# Patient Record
Sex: Female | Born: 1955 | Race: White | Hispanic: No | Marital: Married | Smoking: Never smoker
Health system: Southern US, Community
[De-identification: ages and names within clinical notes are randomized; demographics above are authoritative.]

## PROBLEM LIST (undated history)

## (undated) ENCOUNTER — Ambulatory Visit (HOSPITAL_COMMUNITY): Admission: EM | Source: Home / Self Care

## (undated) DIAGNOSIS — K529 Noninfective gastroenteritis and colitis, unspecified: Secondary | ICD-10-CM

## (undated) DIAGNOSIS — I10 Essential (primary) hypertension: Secondary | ICD-10-CM

## (undated) DIAGNOSIS — I509 Heart failure, unspecified: Secondary | ICD-10-CM

## (undated) HISTORY — DX: Noninfective gastroenteritis and colitis, unspecified: K52.9

## (undated) HISTORY — PX: ABDOMINAL HYSTERECTOMY: SHX81

---

## 1997-11-23 ENCOUNTER — Emergency Department (HOSPITAL_COMMUNITY): Admission: EM | Admit: 1997-11-23 | Discharge: 1997-11-23 | Payer: Self-pay | Admitting: Family Medicine

## 1997-11-25 ENCOUNTER — Emergency Department (HOSPITAL_COMMUNITY): Admission: EM | Admit: 1997-11-25 | Discharge: 1997-11-25 | Payer: Self-pay | Admitting: Emergency Medicine

## 1998-03-06 HISTORY — PX: ABDOMINAL HYSTERECTOMY W/ PARTIAL VAGINACTOMY: SUR660

## 2001-06-12 ENCOUNTER — Emergency Department (HOSPITAL_COMMUNITY): Admission: EM | Admit: 2001-06-12 | Discharge: 2001-06-12 | Payer: Self-pay | Admitting: Emergency Medicine

## 2001-06-14 ENCOUNTER — Inpatient Hospital Stay (HOSPITAL_COMMUNITY): Admission: EM | Admit: 2001-06-14 | Discharge: 2001-06-23 | Payer: Self-pay | Admitting: *Deleted

## 2001-06-14 ENCOUNTER — Encounter: Payer: Self-pay | Admitting: *Deleted

## 2001-06-15 ENCOUNTER — Encounter: Payer: Self-pay | Admitting: Surgery

## 2001-06-18 ENCOUNTER — Encounter: Payer: Self-pay | Admitting: Family Medicine

## 2001-06-19 ENCOUNTER — Encounter (HOSPITAL_BASED_OUTPATIENT_CLINIC_OR_DEPARTMENT_OTHER): Payer: Self-pay | Admitting: General Surgery

## 2001-06-21 ENCOUNTER — Encounter (HOSPITAL_BASED_OUTPATIENT_CLINIC_OR_DEPARTMENT_OTHER): Payer: Self-pay | Admitting: General Surgery

## 2003-03-12 ENCOUNTER — Emergency Department (HOSPITAL_COMMUNITY): Admission: EM | Admit: 2003-03-12 | Discharge: 2003-03-12 | Payer: Self-pay | Admitting: Emergency Medicine

## 2003-11-10 ENCOUNTER — Emergency Department (HOSPITAL_COMMUNITY): Admission: EM | Admit: 2003-11-10 | Discharge: 2003-11-10 | Payer: Self-pay | Admitting: Family Medicine

## 2003-11-24 ENCOUNTER — Emergency Department (HOSPITAL_COMMUNITY): Admission: EM | Admit: 2003-11-24 | Discharge: 2003-11-24 | Payer: Self-pay | Admitting: Family Medicine

## 2005-05-01 ENCOUNTER — Emergency Department (HOSPITAL_COMMUNITY): Admission: EM | Admit: 2005-05-01 | Discharge: 2005-05-01 | Payer: Self-pay | Admitting: Family Medicine

## 2005-07-29 ENCOUNTER — Emergency Department (HOSPITAL_COMMUNITY): Admission: EM | Admit: 2005-07-29 | Discharge: 2005-07-29 | Payer: Self-pay | Admitting: Emergency Medicine

## 2005-08-09 ENCOUNTER — Emergency Department (HOSPITAL_COMMUNITY): Admission: EM | Admit: 2005-08-09 | Discharge: 2005-08-09 | Payer: Self-pay | Admitting: Emergency Medicine

## 2007-02-06 ENCOUNTER — Emergency Department (HOSPITAL_COMMUNITY): Admission: EM | Admit: 2007-02-06 | Discharge: 2007-02-07 | Payer: Self-pay | Admitting: Family Medicine

## 2008-03-06 HISTORY — PX: CARDIAC CATHETERIZATION: SHX172

## 2008-03-09 ENCOUNTER — Inpatient Hospital Stay (HOSPITAL_COMMUNITY): Admission: EM | Admit: 2008-03-09 | Discharge: 2008-03-13 | Payer: Self-pay | Admitting: Emergency Medicine

## 2008-03-10 ENCOUNTER — Encounter (INDEPENDENT_AMBULATORY_CARE_PROVIDER_SITE_OTHER): Payer: Self-pay | Admitting: Internal Medicine

## 2008-04-10 ENCOUNTER — Ambulatory Visit: Payer: Self-pay | Admitting: Family Medicine

## 2008-06-30 ENCOUNTER — Ambulatory Visit (HOSPITAL_COMMUNITY): Admission: RE | Admit: 2008-06-30 | Discharge: 2008-06-30 | Payer: Self-pay | Admitting: Cardiology

## 2008-06-30 ENCOUNTER — Encounter (INDEPENDENT_AMBULATORY_CARE_PROVIDER_SITE_OTHER): Payer: Self-pay | Admitting: Cardiology

## 2008-07-21 ENCOUNTER — Ambulatory Visit: Payer: Self-pay | Admitting: Family Medicine

## 2010-04-10 ENCOUNTER — Emergency Department (HOSPITAL_COMMUNITY): Payer: Self-pay

## 2010-04-10 ENCOUNTER — Observation Stay (HOSPITAL_COMMUNITY)
Admission: EM | Admit: 2010-04-10 | Discharge: 2010-04-11 | Disposition: A | Discharge status: Home / Self Care | Payer: Self-pay | Attending: Internal Medicine | Admitting: Internal Medicine

## 2010-04-10 DIAGNOSIS — I10 Essential (primary) hypertension: Secondary | ICD-10-CM | POA: Insufficient documentation

## 2010-04-10 DIAGNOSIS — R05 Cough: Secondary | ICD-10-CM | POA: Insufficient documentation

## 2010-04-10 DIAGNOSIS — R0602 Shortness of breath: Secondary | ICD-10-CM | POA: Insufficient documentation

## 2010-04-10 DIAGNOSIS — Z7982 Long term (current) use of aspirin: Secondary | ICD-10-CM | POA: Insufficient documentation

## 2010-04-10 DIAGNOSIS — R059 Cough, unspecified: Secondary | ICD-10-CM | POA: Insufficient documentation

## 2010-04-10 DIAGNOSIS — M47814 Spondylosis without myelopathy or radiculopathy, thoracic region: Secondary | ICD-10-CM | POA: Insufficient documentation

## 2010-04-10 DIAGNOSIS — R0989 Other specified symptoms and signs involving the circulatory and respiratory systems: Secondary | ICD-10-CM | POA: Insufficient documentation

## 2010-04-10 DIAGNOSIS — R0609 Other forms of dyspnea: Secondary | ICD-10-CM | POA: Insufficient documentation

## 2010-04-10 DIAGNOSIS — I509 Heart failure, unspecified: Secondary | ICD-10-CM | POA: Insufficient documentation

## 2010-04-10 DIAGNOSIS — I428 Other cardiomyopathies: Secondary | ICD-10-CM | POA: Insufficient documentation

## 2010-04-10 DIAGNOSIS — Z79899 Other long term (current) drug therapy: Secondary | ICD-10-CM | POA: Insufficient documentation

## 2010-04-10 DIAGNOSIS — R197 Diarrhea, unspecified: Secondary | ICD-10-CM | POA: Insufficient documentation

## 2010-04-10 DIAGNOSIS — R079 Chest pain, unspecified: Principal | ICD-10-CM | POA: Insufficient documentation

## 2010-04-10 LAB — LIPASE, BLOOD: Lipase: 31 U/L (ref 11–59)

## 2010-04-10 LAB — BASIC METABOLIC PANEL
GFR calc Af Amer: >60 mL/min (ref >60)
GFR calc non Af Amer: >60 mL/min (ref >60)
Glucose, Bld: 93 mg/dL (ref 70–99)
Potassium: 4.1 mEq/L (ref 3.5–5.1)
Sodium: 139 mEq/L (ref 135–145)

## 2010-04-10 LAB — DIFFERENTIAL
Basophils Absolute: 0 10*3/uL (ref 0.0–0.1)
Eosinophils Relative: 2 % (ref 0–5)
Lymphocytes Relative: 41 % (ref 12–46)
Neutro Abs: 4.2 10*3/uL (ref 1.7–7.7)

## 2010-04-10 LAB — HEPATIC FUNCTION PANEL
Albumin: 3.6 g/dL (ref 3.5–5.2)
Bilirubin, Direct: 0.1 mg/dL (ref 0.0–0.3)
Indirect Bilirubin: 0.4 mg/dL (ref 0.3–0.9)
Total Bilirubin: 0.5 mg/dL (ref 0.3–1.2)

## 2010-04-10 LAB — POCT CARDIAC MARKERS
CKMB, poc: <1 ng/mL — ABNORMAL LOW (ref 1.0–8.0)
Troponin i, poc: <0.05 ng/mL (ref 0.00–0.09)

## 2010-04-10 LAB — CK TOTAL AND CKMB (NOT AT ARMC)
Relative Index: INVALID (ref 0.0–2.5)
Total CK: 68 U/L (ref 7–177)

## 2010-04-10 LAB — CBC
HCT: 39.2 % (ref 36.0–46.0)
RDW: 12.5 % (ref 11.5–15.5)
WBC: 8.8 10*3/uL (ref 4.0–10.5)

## 2010-04-10 LAB — BRAIN NATRIURETIC PEPTIDE: Pro B Natriuretic peptide (BNP): <30 pg/mL (ref 0.0–100.0)

## 2010-04-10 LAB — TROPONIN I: Troponin I: 0.01 ng/mL (ref 0.00–0.06)

## 2010-04-10 LAB — D-DIMER, QUANTITATIVE: D-Dimer, Quant: <0.22 ug/mL-FEU (ref 0.00–0.48)

## 2010-04-11 LAB — LIPID PANEL
LDL Cholesterol: 106 mg/dL — ABNORMAL HIGH (ref 0–99)
Triglycerides: 244 mg/dL — ABNORMAL HIGH (ref <150)

## 2010-04-11 LAB — CBC
MCH: 29.2 pg (ref 26.0–34.0)
MCHC: 32.8 g/dL (ref 30.0–36.0)
Platelets: 246 10*3/uL (ref 150–400)
RDW: 12.4 % (ref 11.5–15.5)

## 2010-04-11 LAB — COMPREHENSIVE METABOLIC PANEL
CO2: 26 mEq/L (ref 19–32)
Calcium: 8.9 mg/dL (ref 8.4–10.5)
Chloride: 105 mEq/L (ref 96–112)
Creatinine, Ser: 0.83 mg/dL (ref 0.4–1.2)
GFR calc non Af Amer: >60 mL/min (ref >60)
Glucose, Bld: 113 mg/dL — ABNORMAL HIGH (ref 70–99)
Total Bilirubin: 0.7 mg/dL (ref 0.3–1.2)

## 2010-04-11 LAB — CARDIAC PANEL(CRET KIN+CKTOT+MB+TROPI)
CK, MB: 1 ng/mL (ref 0.3–4.0)
Relative Index: INVALID (ref 0.0–2.5)
Total CK: 68 U/L (ref 7–177)
Troponin I: 0.02 ng/mL (ref 0.00–0.06)
Troponin I: 0.01 ng/mL (ref 0.00–0.06)

## 2010-04-11 LAB — TSH: TSH: 1.559 u[IU]/mL (ref 0.350–4.500)

## 2010-04-11 LAB — MAGNESIUM: Magnesium: 2.2 mg/dL (ref 1.5–2.5)

## 2010-04-17 NOTE — Discharge Summary (Signed)
[name], [name]                ACCOUNT NO.:  [id number]  MEDICAL RECORD NO.:  [id number]           PATIENT TYPE:  I  LOCATION:  4710                         FACILITY:  [facility]  PHYSICIAN:  Hartley Barefoot, MD    DATE OF BIRTH:  07/21/1955  DATE OF ADMISSION:  04/10/2010 DATE OF DISCHARGE:  04/11/2010                              DISCHARGE SUMMARY   DISCHARGE DIAGNOSES: 1. Chest pain, rule out for acute coronary syndrome.   Other past medical history, 2. history of systolic heart failure,     ejection fraction 40-45%, 2010. 3. Nonischemic cardiomyopathy. 4. History of chronic diarrhea. 5. Hypertension.  PAST SURGICAL HISTORY:  Hysterectomy.  DISCHARGE MEDICATIONS: 1. Acid reducer one tablet by mouth twice daily. 2. Aspirin 81 mg p.o. every morning. 3. Cetirizine 10 mg one tablet by mouth daily. 4. Furosemide 40 mg half tablet by mouth daily. 5. Lisinopril 20 mg one tablet by mouth every morning. 6. Loperamide 2 mg two capsules by mouth three times a day before     meals. 7. Metoprolol 100 mg half tablet by mouth twice daily. 8. Sleep-Aid two tablets by mouth daily at bedtime. 9. Spironolactone 25 mg one tablet by mouth every morning. 10.Tylenol Extra Strength 500 mg two tablet by mouth twice daily as     needed.  DISPOSITION AND FOLLOWUP:  [name] will follow with her Cardiology, Dr. Anne Fu, on April 13, 2010, at 9:30 a.m.  She will need further evaluation of her heart failure and chest pain.  PROCEDURES PERFORMED: 1. A chest x-ray showed borderline cardiomegaly.  Mild thoracic     spondylosis.  Cardiomegaly without acute cardiopulmonary findings. 2. A 2-D echo results pending at this time.  BRIEF HISTORY OF PRESENT ILLNESS:  This is a very pleasant 55 year old with past medical history of nonischemic cardiomyopathy, ejection fraction 45% by 2-D echo in April 2010, hypertension, and chronic diarrhea who presented at to the emergency department after  experiencing chest pain.  The patient relates that she had an episode of chest pain that happened at 4:00 p.m. while she was sitting on her recliner. Episode lasted for 15-20 minutes, and she described the pain as sharp and similar to the episode that she had last year when she was diagnosed with the cardiomyopathy.  Chest pain involved the left axillary area along her left breast area.  She denies nausea, vomiting, or diaphoresis.  HOSPITAL COURSE: 1. Noncardiac chest pain.  The patient was admitted to telemetry under     observation.  Cardiac enzymes x3 negative.  EKG with no ST-segment     elevation.  A 2-D echo was ordered and is pending at this time.  She     had a cardiac cath last year that showed normal coronary artery     disease.  A TSH was normal at 1.5.  A D-dimer was also normal at     0.22.  Maybe her episode of chest pain was musculoskeletal pain.     The patient will follow up with her cardiologist on April 13, 2010, for further evaluation and to review 2-D  echo results.  The     patient was not in heart failure exacerbation.  Her BNP was less     than 30.  A fasting lipid panel was ordered, LDL was at 106, HDL     was at 39. Lipase was negative at 31. 2. All her other medical chronic problems remained stable.  The patient on the day of discharge was in improved condition, no chest pain.  Blood pressure 107/72, sat 95% on room air, respirations 12, pulse 78, temperature 97.7.  Sodium 140, potassium 3.8, chloride 105, bicarb 26, BUN 13, creatinine 0.83, glucose 113.  White blood cells 8.9, hemoglobin 11.9, platelets 246.  The patient was discharged in a stable condition.     Hartley Barefoot, MD     BR/MEDQ  D:  04/11/2010  T:  04/12/2010  Job:  [id number]  cc:   Jake Bathe, MD  Electronically Signed by Hartley Barefoot MD on 04/17/2010 10:14:03 PM

## 2010-05-02 NOTE — H&P (Signed)
[name], [name]                ACCOUNT NO.:  [id number]  MEDICAL RECORD NO.:  [id number]           PATIENT TYPE:  E  LOCATION:  [facility]                         FACILITY:  [facility]  PHYSICIAN:  Eduard Clos, MDDATE OF BIRTH:  1955/12/20  DATE OF ADMISSION:  04/10/2010 DATE OF DISCHARGE:                             HISTORY & PHYSICAL   PRIMARY CARDIOLOGIST:  Jake Bathe, MD  CHIEF COMPLAINT:  Chest pain.  HISTORY OF PRESENT ILLNESS:  This is a 55 year old female with known history of left heart failure, hypertension, chronic diarrhea, has experienced some chest pain.  The patient says the chest pain happened around 4 p.m. while she was in-house with mild shortness of breath.  It is more on the left side of the chest, increased on deep inspiration, and is involving the left axillary area along her left breast area.  She has had no nausea, vomiting, or diaphoresis.  The whole incident lasted for 15 minutes and got resolved.  At this time, the patient is admitted for further observation.  The patient denies any palpitation, dizziness, or loss of consciousness. Denies any headache or visual symptoms.  Denies any focal deficit, abdominal pain, fever, or chills.  The patient does have chronic cough.  The patient has been recently started on spironolactone and has been on spironolactone for 2 weeks because of increasing cough.  PAST MEDICAL HISTORY: 1. History of systolic heart failure with last EF measure in April     2010 was 40-45%.  The patient also had a cardiac cath in 2010,     which as per record showed diffuse severe decrease EF of 20-25%     with no coronary artery disease. 2. History of chronic diarrhea.  PAST SURGICAL HISTORY:  Partial hysterectomy and cardiac cath.  MEDICATIONS ON ADMISSION:  As per the list provided by the patient is: 1. Aspirin 81 mg p.o. daily. 2. Klor-Con 10 mEq p.o. daily. 3. Furosemide 20 mg daily. 4. Spironolactone 25 mg daily. 5.  Metoprolol 50 mg twice daily. 6. Lisinopril 20 mg daily. 7. Bronkaid 25/400 mg daily. 8. Benzonatate. 9. Tylenol Extra Strength p.r.n. 10.Loperamide 2 mg p.r.n. for diarrhea. 11.Cetirizine 10 mg daily.  ALLERGIES:  No known drug allergies.  FAMILY HISTORY:  Significant for the patient's brother having died from massive MI at age 78 and father has had PCI in his late 22.  SOCIAL HISTORY:  The patient is married.  Denies smoking cigarettes, drinking alcohol, or using illegal drugs.  REVIEW OF SYSTEMS:  As per history of present illness, nothing else significant.  PHYSICAL EXAMINATION:  GENERAL:  The patient is examined at bedside, not in acute distress. VITAL SIGNS:  Blood pressure is 140/70, pulse is 70 per minute, temperature 97.8, respirations 18 per minute, and O2 sat 96%. HEENT:  Anicteric.  No pallor.  No discharge from ears, eyes, nose, or mouth. CHEST:  Bilateral air entry present.  No rhonchi.  No crepitation. HEART:  S1 and S2 heard. ABDOMEN:  Soft and nontender.  Bowel sounds heard. CENTRAL NERVOUS SYSTEM:  The patient is alert and oriented to time, place, and  person. EXTREMITIES:  Moves upper and lower extremity 5/5.  Peripheral pulses are felt.  No edema.  LABORATORY DATA:  EKG shows normal sinus rhythm with heart rate around 60 beats per minute with poor R-wave progression and nonspecific ST-T changes.  Chest x-ray shows borderline cardiomegaly, mild thoracic spondylosis.  CBC:  WBC is 8.8, hemoglobin is 13.1, hematocrit is 39.2, and platelets are 270.  Basic metabolic panel:  Sodium 139, potassium 4.1, chloride 102, carbon dioxide 26, glucose 93, BUN 13, creatinine 0.7, calcium 9.5.  CK-MB less than 1, troponin I less than 0.05.  BNP less than 30.  ASSESSMENT: 1. Chest pain, to rule out acute coronary syndrome. 2. History of chronic systolic heart failure with last ejection     fraction measured in April 2010, was 40-45%. 3. Chronic diarrhea. 4.  Hypertension.  PLAN: 1. At this time, we will admit the patient to telemetry. 2. For chest pain, the patient at this time is pain free.  We will     place the patient on aspirin, nitroglycerin p.r.n. and cycle     cardiac markers.  Get 2-D echo.  We will also check LFTs and lipase     along with the D-dimer. 3. For hypertension and chronic diarrhea, we will continue present     medication. 4. Further recommendation based on tests ordered and clinical course.     Eduard Clos, MD     ANK/MEDQ  D:  04/10/2010  T:  04/10/2010  Job:  [id number]  cc:   Jake Bathe, MD  Electronically Signed by Midge Minium MD on 05/02/2010 04:43:35 PM

## 2010-06-20 LAB — BASIC METABOLIC PANEL
BUN: 16 mg/dL (ref 6–23)
BUN: 24 mg/dL — ABNORMAL HIGH (ref 6–23)
CO2: 27 mEq/L (ref 19–32)
Chloride: 101 mEq/L (ref 96–112)
Chloride: 99 mEq/L (ref 96–112)
Creatinine, Ser: 0.89 mg/dL (ref 0.4–1.2)
GFR calc non Af Amer: >60 mL/min (ref >60)
Glucose, Bld: 101 mg/dL — ABNORMAL HIGH (ref 70–99)
Glucose, Bld: 96 mg/dL (ref 70–99)
Potassium: 4 mEq/L (ref 3.5–5.1)

## 2010-06-20 LAB — CBC
HCT: 45.4 % (ref 36.0–46.0)
Hemoglobin: 14.6 g/dL (ref 12.0–15.0)
MCHC: 32.9 g/dL (ref 30.0–36.0)
MCV: 88.1 fL (ref 78.0–100.0)
MCV: 88.5 fL (ref 78.0–100.0)
MCV: 88.7 fL (ref 78.0–100.0)
Platelets: 317 10*3/uL (ref 150–400)
Platelets: 324 10*3/uL (ref 150–400)
Platelets: 335 10*3/uL (ref 150–400)
RBC: 4.87 MIL/uL (ref 3.87–5.11)
RDW: 13.3 % (ref 11.5–15.5)
RDW: 13.3 % (ref 11.5–15.5)
WBC: 11.3 10*3/uL — ABNORMAL HIGH (ref 4.0–10.5)

## 2010-06-20 LAB — PROTIME-INR
INR: 1 (ref 0.00–1.49)
Prothrombin Time: 13.4 seconds (ref 11.6–15.2)
Prothrombin Time: 13.4 seconds (ref 11.6–15.2)

## 2010-06-20 LAB — TROPONIN I
Troponin I: 0.02 ng/mL (ref 0.00–0.06)
Troponin I: 0.02 ng/mL (ref 0.00–0.06)

## 2010-06-20 LAB — COMPREHENSIVE METABOLIC PANEL
Albumin: 4.1 g/dL (ref 3.5–5.2)
Alkaline Phosphatase: 62 U/L (ref 39–117)
BUN: 9 mg/dL (ref 6–23)
CO2: 27 mEq/L (ref 19–32)
Chloride: 102 mEq/L (ref 96–112)
Creatinine, Ser: 0.88 mg/dL (ref 0.4–1.2)
GFR calc non Af Amer: >60 mL/min (ref >60)
Glucose, Bld: 134 mg/dL — ABNORMAL HIGH (ref 70–99)
Potassium: 3.3 mEq/L — ABNORMAL LOW (ref 3.5–5.1)
Total Bilirubin: 0.7 mg/dL (ref 0.3–1.2)

## 2010-06-20 LAB — DIFFERENTIAL
Basophils Absolute: 0 10*3/uL (ref 0.0–0.1)
Basophils Relative: 0 % (ref 0–1)
Eosinophils Absolute: 0.1 10*3/uL (ref 0.0–0.7)
Monocytes Relative: 6 % (ref 3–12)
Neutro Abs: 7 10*3/uL (ref 1.7–7.7)
Neutrophils Relative %: 67 % (ref 43–77)

## 2010-06-20 LAB — LIPID PANEL
LDL Cholesterol: 134 mg/dL — ABNORMAL HIGH (ref 0–99)
VLDL: 21 mg/dL (ref 0–40)

## 2010-06-20 LAB — CK TOTAL AND CKMB (NOT AT ARMC)
CK, MB: 1.1 ng/mL (ref 0.3–4.0)
CK, MB: 1.5 ng/mL (ref 0.3–4.0)
Relative Index: INVALID (ref 0.0–2.5)
Total CK: 75 U/L (ref 7–177)

## 2010-06-20 LAB — POCT I-STAT, CHEM 8
Chloride: 106 mEq/L (ref 96–112)
Glucose, Bld: 85 mg/dL (ref 70–99)
HCT: 45 % (ref 36.0–46.0)
Hemoglobin: 15.3 g/dL — ABNORMAL HIGH (ref 12.0–15.0)
Potassium: 3.9 mEq/L (ref 3.5–5.1)
Sodium: 139 mEq/L (ref 135–145)

## 2010-06-20 LAB — POCT CARDIAC MARKERS
CKMB, poc: <1 ng/mL — ABNORMAL LOW (ref 1.0–8.0)
Myoglobin, poc: 53.8 ng/mL (ref 12–200)
Troponin i, poc: <0.05 ng/mL (ref 0.00–0.09)

## 2010-06-20 LAB — APTT: aPTT: 32 seconds (ref 24–37)

## 2010-07-19 NOTE — H&P (Signed)
[name], [name]                ACCOUNT NO.:  [id number]   MEDICAL RECORD NO.:  [id number]          PATIENT TYPE:  EMS   LOCATION:  [facility]                         FACILITY:  [facility]   PHYSICIAN:  Ladell Pier, M.D.   DATE OF BIRTH:  [birth year]   DATE OF ADMISSION:  03/09/2008  DATE OF DISCHARGE:                              HISTORY & PHYSICAL   The patient is unassigned.   CHIEF COMPLAINT:  Is shortness of breath.   HISTORY OF PRESENT ILLNESS:  The patient is a 55 year old female without  any significant past medical history until December 29.  The patient  stated that for the past month she has been having chest pain and  shortness of breath.  She did not want to go to the doctor.  She does  not have a primary care doctor.  She went to visit her family in  [place] and her mother talked her into going to the doctor.  She went  to an Urgent Care and was told that she had high blood pressure and  heart failure.  She was offered admission but the patient did not want  to be admitted in [place].  She wanted to come home.  She states that  she has to sit up to sleep at night.  That has been going on for quite  some time, but been getting worse recently.  She has not laid in her bed  for over a week secondary to shortness of breath when she lies down.  She has no swelling in her legs.  The chest pain is either when she is  lying down or with activity if she is trying to play with her son.  She  has known diaphoresis with the chest pain.  The pain is on the left side  of her chest.  No radiation.  She had nausea, vomiting, one episode last  week.   PAST MEDICAL HISTORY:  Newly diagnosed CHF and hypertension.  She also  had a hysterectomy.   FAMILY HISTORY:  Mother is healthy at 14, except for breast cancer.  Father is 38 and history of diabetes.   SOCIAL HISTORY:  She does not smoke.  She does not drink alcohol.  No IV  drug use.  She is married and she has three children.   MEDICATIONS:  1. She was started on lisinopril HCTZ 20/12.5 daily.  2. And Benzonatate 200 mg as needed.   ALLERGIES:  None.   REVIEW OF SYSTEMS:  Negative.  Otherwise as stated in HPI.   PHYSICAL EXAM:  Temperature 97.7, blood pressure 139/91, pulse of 91,  respirations 14, pulse ox 99%.  GENERAL:  Patient is sitting up on stretcher.  Does not seem to be in  any acute distress.  HEENT:  Head is normocephalic, atraumatic.  Pupils equal, round and  reactive to light.  Throat is without erythema.  CARDIOVASCULAR:  Regular rate, rhythm.  LUNGS:  Her lungs were clear bilaterally.  ABDOMEN:  Soft, nontender, nondistended.  Positive bowel sounds.  EXTREMITIES:  Without edema.   LABORATORIES:  WBC 10.4, hemoglobin 14.1,  MCV 8.1, platelet 317.  Sodium  139, potassium 3.9, chloride 106, BUN 12, creatinine 1.1, glucose 85.  Myoglobin 53.8, MB less than one, troponin less than 0.05.  BNP 259.  Chest x-ray showed cardiomegaly without any acute process.  EKG shows no  acute ST-segment elevation or depression.   ASSESSMENT/PLAN:  1. New onset congestive heart failure.  2. Chest pain.  3. Hypertension.  4. Obesity.   We will admit the patient to the hospital to rule out myocardial  infarction.  Most likely will need an inpatient stress test.  We will  get a two-dimensional echocardiogram for now, cycle cardiac enzymes,  repeat electrocardiogram in the morning.  The patient does not have  unstable angina since her symptoms have been going on for over a month  and she is chest pain free at the moment.  We will put her on  intravenous Lasix and an ACE inhibitor.  Most likely her ejection  fraction is low.  We will need to add a beta blocker for congestive  heart failure therapy.  We will also check fasting lipid panel and TSH.      Ladell Pier, M.D.  Electronically Signed     NJ/MEDQ  D:  03/09/2008  T:  03/09/2008  Job:  [id number]

## 2010-07-19 NOTE — Consult Note (Signed)
NAMEBRYSTAL, KILDOW                ACCOUNT NO.:  [id number]   MEDICAL RECORD NO.:  [id number]           PATIENT TYPE:   LOCATION:                                 FACILITY:   PHYSICIAN:  Jake Bathe, MD      DATE OF BIRTH:  [birth year]   DATE OF CONSULTATION:  03/12/2008  DATE OF DISCHARGE:                                 CONSULTATION   REFERRING PHYSICIAN:  [facility] hospitalist, Beckey Rutter, MD   REASON FOR CONSULTATION:  Ms. Pangilinan is being seen at the request of Dr.  Tamsen Roers of [facility] hospitalist for the evaluation of new-onset systolic  heart failure.   HISTORY OF PRESENT ILLNESS:  A 55 year old female with newly discovered  hypertension, mild obesity, who over the past month or two has been  experiencing increased shortness of breath, dyspnea on exertion which  has gotten more severe with orthopnea, PND, and accompanying chest  tightness with exertion.  She has three children at home and has noticed  while playing with her younger children, she has been extremely short of  breath.  She gets extremely short of breath with one flight of stairs,  New York Heart Association class III heart failure.  Echocardiogram was  performed which demonstrated an EF of approximately 20% with diffuse  hypokinesis.  No other significant valvular abnormalities were seen.  She has no prior history of coronary artery disease or myocardial  infarction; however, her brother did die of sudden cardiac death at age  50 from a massive heart attack.  She has never been told that she is  hypertensive, but she has not seen a doctor in several years.  While in  [place] visiting her family over Christmas, this is when she first  visited a doctor and was given lisinopril for anti-hypertensive.  Currently, she is still having some trouble with orthopnea/PND while  lying flat and last night slept with her bed angle at approximately 40  degrees.  She is comfortable at rest, but still somewhat short of  breath  with exertion.  Now, New York Heart Association class II.   PAST MEDICAL HISTORY:  1. Newly discovered hypertension.  2. Newly discovered systolic heart failure.  3. Chest pain.  4. Mild obesity.   PAST SURGICAL HISTORY:  Partial hysterectomy.  Ovaries are still in  place.   ALLERGIES:  No known drug allergies.   MEDICATIONS:  Currently she is on,  1. Metoprolol 12.5 mg twice a day.  2. Lisinopril 10 mg once a day.  3. Lasix 40 mg p.o. b.i.d. which I will change to Lasix IV 40 b.i.d.  4. Aspirin 81 mg a day.  5. Other p.r.n. medications.   SOCIAL HISTORY:  She is married, has three children.  Denies any  tobacco, alcohol, or illicit drug use.  She denies any possibility of  HIV or hepatitis.  Her husband has disability, but is mobile.   FAMILY HISTORY:  Her brother died of a massive MI at age 73, sudden  cardiac death.  Her father has had PCI in his late 57s with  coronary  artery disease.   REVIEW OF SYSTEMS:  She denies any syncope, significant palpitations.  No bleeding disorders.  She has occasional hot flashes, questionable  menopausal symptoms, positive orthopnea, chest pain, dyspnea on  exertion, orthopnea, PND as described above.  She denies any exotic  travel, was recently to [place].  No recent sick contacts or viral  illnesses to her knowledge.  No HIV risk or hepatitis risk.  Unless  stated above, all other 12 review of systems negative.   PHYSICAL EXAMINATION:  Temperature 97.9, pulse 86, respirations 18-20,  systolic pressure is 107/67 this morning with a high of 131/82  yesterday, satting 94% on room air.  Her weight, January 4, she was 72  kg; January 7, she was 68 kg.  In's and out's have not been clearly  recorded.   LABORATORY DATA:  EKG shows sinus rhythm with QTC slightly prolonged at  489 milliseconds, no Qs or ST-segment changes.  She does have borderline  LVH in the lateral leads.  Chest x-ray personally reviewed shows  positive  cardiomegaly with no pleural effusions.  TSH was normal at 1.9,  D-dimer was normal at 0.47.  Cardiac enzymes were normal x3, total  cholesterol 199, triglycerides 106, HDL 44, LDL 134 mildly elevated, BNP  259 elevated, PT/PTT was within normal limits.  Sodium 138, potassium  4.0, bicarb 27, BUN 16, creatinine 0.87, glucose 101, white count 11.8,  hemoglobin 14.6, hematocrit 45, platelets 335.   ASSESSMENT AND PLAN:  A 55 year old female with newly discovered acute  systolic left ventricular dysfunction/heart failure with newly  discovered hypertension, mild obesity, chest pain.  1. Acute systolic left ventricular dysfunction.  We have discussed at      length the importance of discovering an etiology for this and given      her family history as well as possibility of hypertension with      mildly elevated lipids, coronary artery disease must be excluded      with cardiac catheterization.  She understands the risks and      benefits of this procedure and we will pursue this tomorrow      morning, Friday, January 8.  Risk and benefits including stroke,      heart attack, death, renal impairment, bleeding, arterial damage      had been explained at length.  When lying flat, she still has a      little bit of a mild cough and I will diurese her slightly more      aggressively today with Lasix 40 mg IV twice a day.  She has had a      good weight loss since being here in the hospital as noted above;      however, I do feel as though she has more fluid to lose given her      continued orthopnea.  I do agree with lisinopril currently 10 mg as      well as metoprolol low-dose.  Hopefully, we will be able to      increase this soon to approach goal dosages.  I also agree with      aspirin at this point.  Depending on her coronary evaluation      possibilities include bypass surgery PCI with continued optimal      medical management.  If coronary arteries are clean, possible other      etiology  includes hypertensive cardiomyopathy (note, echocardiogram      does show moderate LVH).  Viral cardiomyopathy is also a      possibility.  Her thyroid, HIV risk factors were all normal.  2. Chest pain - Certainly her chest tightness associated with her      dyspnea on exertion could be secondary to her acute systolic heart      failure.  Nonetheless, we will be performing cardiac      catheterization to evaluate her coronary arteries.  Cardiac enzymes      were reassuring.  3. Obesity - Mild.  She has had some weight loss with her diuresis.   She understands the severity of her situation given her severely  weakened heart.  After initial evaluation is complete, if she still  continues to have weakened LV function less than 35%, then she would be  a defibrillator candidate to protect her from sudden cardiac death or  other dangerous arrhythmias.      Jake Bathe, MD  Electronically Signed     MCS/MEDQ  D:  03/12/2008  T:  03/12/2008  Job:  [id number]   cc:   Beckey Rutter, MD

## 2010-07-19 NOTE — Discharge Summary (Signed)
NAMEHEILEY, [name]                ACCOUNT NO.:  [id number]   MEDICAL RECORD NO.:  [id number]          PATIENT TYPE:  INP   LOCATION:  3707                         FACILITY:  [facility]   PHYSICIAN:  Charlestine Massed, MDDATE OF BIRTH:  [birth year]   DATE OF ADMISSION:  03/09/2008  DATE OF DISCHARGE:                               DISCHARGE SUMMARY   REASON FOR ADMISSION:  Shortness of breath.   HOSPITAL COURSE:  A 55 year old female, [name], was admitted  with increasing shortness of breath.  She stated that for the past 2 to  3 months she has been having increasing shortness of breath.  She cannot  lie down flat.  She has a strong history suggesting orthopnea and  paroxysmal nocturnal dyspnea but she does not have any swelling of the  legs.  She cannot make it to 1 block without getting short of breath but  she denies chest pain at any time.  She came in because she was also  having some chest pain which was coming in on and off but more of  shortness of breath is her symptom.  She denies any palpitation, loss of  consciousness, or dizziness.   She was admitted for evaluation of clinical heart failure.  Patient has  clinical signs and symptoms of heart failure, was started on diuretics  today.  She was on lisinopril and was also started on Lopressor 12.5  b.i.d.  Patient is currently on these medications.  Cardiac markers have  been found to be negative.   She has a strong family history of heart disease as a brother who was 51  years old died of acute MI a few months back.   An echocardiogram is still pending.  Once the echocardiogram results  come back, we will decide upon further stress testing.  If there is  evidence of any systolic dysfunction, then patient needs to have  ischemic workup either in the form of stress testing or  in the order of  coronary angiogram with cardiac consult.  If it is diastolic dysfunction  which could be possible in the setting of   hypertension and could be  possible because of the absence of edema, patient can be worked up with  the stress testing alone. Currently patient is on the floor awaiting the  results of the echocardiogram.   The physician taking over will make the decision with regard to stress  testing or otherwise at his/her discretion, based upon the results of  the echocardiogram.   CURRENT MEDICATIONS:  1. Aspirin 81 mg daily.  2. Lasix 20 mg every 12 hours.  3. Lisinopril 10 mg daily.  4. Lopressor 12.5 mg b.i.d.  5. Protonix 40 mg daily.  6. Nitroglycerin p.r.n.   The complete details of further hospital course and medications and  disposition will be dictated at the time of discharge.      Charlestine Massed, MD  Electronically Signed     UT/MEDQ  D:  03/10/2008  T:  03/10/2008  Job:  [id number]

## 2010-07-19 NOTE — Discharge Summary (Signed)
NAMEJACKSON, [name]                ACCOUNT NO.:  [id number]   MEDICAL RECORD NO.:  [id number]          PATIENT TYPE:  INP   LOCATION:  3707                         FACILITY:  [facility]   PHYSICIAN:  Beckey Rutter, MD  DATE OF BIRTH:  01-16-1956   DATE OF ADMISSION:  03/09/2008  DATE OF DISCHARGE:  03/13/2008                               DISCHARGE SUMMARY   ADDENDUM   PRIMARY CARE PHYSICIAN:  Unassigned.    The patient had a 2-D echo with the results showing a severe decrease in  left ventricular ejection fraction.  The patient had a cardiology  consultation done by Dr. Anne Fu.  The patient was evaluated and found to  have indication for cardiac catheterization to ascertain the left  ventricular ejection fraction and to evaluate for possible CAD as the  etiology behind severe drop in the ejection fraction.  The patient is  status post coronary angiogram done this morning March 13, 2008.  The  impression is:  1. No coronary artery disease.  2. Diffuse severe decreased ejection fraction of 20-25%.  3. Left ventricular end-diastolic pressure is 10 mmHg.   The patient will be discharged today to follow up with Dr. Anne Fu for  the heart failure management.  The patient will have followup with  [facility] as well.   DISCHARGE MEDICATIONS:  1. Lasix 40 mg p.o. daily.  2. Lisinopril 10 mg daily.  3. K-Dur 10 mEq p.o. daily.  4. Lopressor 12.5 mg b.i.d.  5. Aspirin 81 mg daily.   DISCHARGE PLAN:  The patient is aware of the need of followup, and she  was given the contact number for Dr. Anne Fu and [facility].  All  her questions are encouraged and answered.  The patient will wait until  recovery from the sedatives.  The patient has got during the cardiac  cath and then she will be discharged today.  She is stable for  discharge.   Thank you very much.      Beckey Rutter, MD  Electronically Signed     EME/MEDQ  D:  03/13/2008  T:  03/13/2008  Job:   [id number]

## 2010-07-22 NOTE — Discharge Summary (Signed)
[facility]  Patient:    [name], [name] Visit Number: [id number] MRN: [id number]          Service Type: SUR Location: [id number] Attending Physician:  Altamese [place]. Dictated by:   Alwyn Pea, M.D. Admit Date:  06/14/2001 Discharge Date: 06/23/2001                             Discharge Summary  DATE OF BIRTH:  1955/08/18  ADMITTING DIAGNOSES:  Intestinal adhesions with obstruction.  SECONDARY DIAGNOSES:  Nausea and vomiting.  CONSULTATIONS:  General surgery.  HISTORY OF PRESENT ILLNESS:  Briefly, this is a 55 year old African-American female who presented with a history of nausea and vomiting.  Was admitted, placed on bowel rest, and continued to improve slowly.  A surgical consultation was obtained in order to ascertain whether or not patient was a candidate for surgery.  MAJOR PROCEDURES:  None.  HOSPITAL COMPLICATIONS:  None.  DISPOSITION:  Patient continued to do well and was discharged to home in good condition and instructed to follow up with Dr. Pecola Leisure or Dr. Parke Simmers at the [facility]. Dictated by:   Alwyn Pea, M.D. Attending Physician:  Altamese . DD:  07/29/01 TD:  07/31/01 Job: [id number]

## 2010-12-12 LAB — I-STAT 8, (EC8 V) (CONVERTED LAB)
BUN: 10
Glucose, Bld: 114 — ABNORMAL HIGH
Hemoglobin: 13.6
Potassium: 3.5
Sodium: 138
TCO2: 26
pH, Ven: 7.344 — ABNORMAL HIGH

## 2010-12-12 LAB — POCT I-STAT CREATININE: Operator id: 196461

## 2011-05-25 ENCOUNTER — Encounter (HOSPITAL_COMMUNITY): Payer: Self-pay

## 2011-05-25 ENCOUNTER — Emergency Department (INDEPENDENT_AMBULATORY_CARE_PROVIDER_SITE_OTHER)
Admission: EM | Admit: 2011-05-25 | Discharge: 2011-05-25 | Disposition: A | Discharge status: Home / Self Care | Payer: Self-pay | Source: Home / Self Care | Attending: Family Medicine | Admitting: Family Medicine

## 2011-05-25 DIAGNOSIS — J069 Acute upper respiratory infection, unspecified: Secondary | ICD-10-CM

## 2011-05-25 HISTORY — DX: Heart failure, unspecified: I50.9

## 2011-05-25 MED ORDER — GUAIFENESIN-CODEINE 100-10 MG/5ML PO SYRP: ORAL_SOLUTION | ORAL | Status: DC

## 2011-05-25 MED ORDER — AMOXICILLIN 500 MG PO CAPS: 500.0000 mg | ORAL_CAPSULE | Freq: Three times a day (TID) | ORAL | Status: AC

## 2011-05-25 NOTE — ED Provider Notes (Signed)
History     CSN: [id number]  Arrival date & time 05/25/11  4098   First MD Initiated Contact with Patient 05/25/11 1134      Chief Complaint  Patient presents with  . URI    (Consider location/radiation/quality/duration/timing/severity/associated sxs/prior treatment) HPI Comments: The patient reports having nasal congestion , facial pressure, sore throat and a cough. Cough is dry. No fever. Has been taking otc meds with minimal relief. No dizziness. Ears feel plugged and often pop.   The history is provided by the patient.    Past Medical History  Diagnosis Date  . CHF (congestive heart failure)     Past Surgical History  Procedure Date  . Abdominal hysterectomy     History reviewed. No pertinent family history.  History  Substance Use Topics  . Smoking status: Never Smoker   . Smokeless tobacco: Not on file  . Alcohol Use: No    OB History    Grav Para Term Preterm Abortions TAB SAB Ect Mult Living                  Review of Systems  Constitutional: Negative.   HENT: Positive for hearing loss, congestion, sore throat, rhinorrhea and postnasal drip. Negative for neck stiffness and ear discharge.   Eyes: Negative.   Cardiovascular: Negative.   Gastrointestinal: Negative.     Allergies  Review of patient's allergies indicates no known allergies.  Home Medications   Current Outpatient Rx  Name Route Sig Dispense Refill  . FUROSEMIDE 40 MG PO TABS Oral Take 40 mg by mouth daily.    Marland Kitchen LOSARTAN POTASSIUM 50 MG PO TABS Oral Take 50 mg by mouth daily.    Marland Kitchen METOPROLOL SUCCINATE ER 100 MG PO TB24 Oral Take 100 mg by mouth daily. Take with or immediately following a meal.    . SPIRONOLACTONE 25 MG PO TABS Oral Take 25 mg by mouth daily.    . AMOXICILLIN 500 MG PO CAPS Oral Take 1 capsule (500 mg total) by mouth 3 (three) times daily. 30 capsule 0  . GUAIFENESIN-CODEINE 100-10 MG/5ML PO SYRP  1-2 tsp po q 6 hrs prn cough 120 mL 0    BP 152/77  Pulse 78   Temp(Src) 97.6 F (36.4 C) (Oral)  Resp 18  SpO2 100%  Physical Exam  Nursing note and vitals reviewed. Constitutional: She appears well-developed and well-nourished.  HENT:  Head: Normocephalic and atraumatic.       Ears clear, nose congested, throat mild erythema with post nasal drainage.   Neck: Normal range of motion. Neck supple.  Cardiovascular: Normal rate and regular rhythm.   Pulmonary/Chest: Effort normal and breath sounds normal.  Skin: Skin is warm and dry.    ED Course  Procedures (including critical care time)  Labs Reviewed - No data to display No results found.   1. URI (upper respiratory infection)       MDM          Randa Spike, MD 05/25/11 1149

## 2011-05-25 NOTE — Discharge Instructions (Signed)
Tylenol or motrin as needed. Avoid caffeine and milk products. Follow up with your pcp or return if symptoms persist or worsen 

## 2011-05-25 NOTE — ED Notes (Signed)
C/o sore throat, post nasal drainage, productive cough of yellow sputum and nasal congestion for 4 days.  Denies fever.

## 2012-01-04 ENCOUNTER — Emergency Department (HOSPITAL_COMMUNITY): Payer: Self-pay

## 2012-01-04 ENCOUNTER — Encounter (HOSPITAL_COMMUNITY): Payer: Self-pay | Admitting: Physical Medicine and Rehabilitation

## 2012-01-04 ENCOUNTER — Emergency Department (HOSPITAL_COMMUNITY)
Admission: EM | Admit: 2012-01-04 | Discharge: 2012-01-04 | Disposition: A | Discharge status: Home / Self Care | Payer: Self-pay | Attending: Emergency Medicine | Admitting: Emergency Medicine

## 2012-01-04 DIAGNOSIS — R05 Cough: Secondary | ICD-10-CM | POA: Insufficient documentation

## 2012-01-04 DIAGNOSIS — Z79899 Other long term (current) drug therapy: Secondary | ICD-10-CM | POA: Insufficient documentation

## 2012-01-04 DIAGNOSIS — J40 Bronchitis, not specified as acute or chronic: Secondary | ICD-10-CM | POA: Insufficient documentation

## 2012-01-04 DIAGNOSIS — I509 Heart failure, unspecified: Secondary | ICD-10-CM | POA: Insufficient documentation

## 2012-01-04 DIAGNOSIS — R059 Cough, unspecified: Secondary | ICD-10-CM | POA: Insufficient documentation

## 2012-01-04 DIAGNOSIS — Z7982 Long term (current) use of aspirin: Secondary | ICD-10-CM | POA: Insufficient documentation

## 2012-01-04 LAB — URINALYSIS, ROUTINE W REFLEX MICROSCOPIC
Bilirubin Urine: NEGATIVE
Glucose, UA: NEGATIVE mg/dL
Ketones, ur: NEGATIVE mg/dL
Specific Gravity, Urine: 1.011 (ref 1.005–1.030)
pH: 7 (ref 5.0–8.0)

## 2012-01-04 LAB — CBC WITH DIFFERENTIAL/PLATELET
Basophils Absolute: 0 10*3/uL (ref 0.0–0.1)
HCT: 39.9 % (ref 36.0–46.0)
Lymphocytes Relative: 34 % (ref 12–46)
Monocytes Absolute: 1 10*3/uL (ref 0.1–1.0)
Neutro Abs: 6.1 10*3/uL (ref 1.7–7.7)
Neutrophils Relative %: 56 % (ref 43–77)
Platelets: 258 10*3/uL (ref 150–400)
RDW: 12.3 % (ref 11.5–15.5)
WBC: 11 10*3/uL — ABNORMAL HIGH (ref 4.0–10.5)

## 2012-01-04 LAB — COMPREHENSIVE METABOLIC PANEL
ALT: 23 U/L (ref 0–35)
AST: 29 U/L (ref 0–37)
Alkaline Phosphatase: 71 U/L (ref 39–117)
CO2: 24 mEq/L (ref 19–32)
Chloride: 103 mEq/L (ref 96–112)
GFR calc non Af Amer: >90 mL/min (ref >90)
Potassium: 4.5 mEq/L (ref 3.5–5.1)
Sodium: 138 mEq/L (ref 135–145)
Total Bilirubin: 0.3 mg/dL (ref 0.3–1.2)

## 2012-01-04 MED ORDER — FUROSEMIDE 10 MG/ML IJ SOLN
40.0000 mg | Freq: Once | INTRAMUSCULAR | Status: AC
  Administered 2012-01-04: 40 mg via INTRAVENOUS
  Filled 2012-01-04: qty 4

## 2012-01-04 MED ORDER — ALBUTEROL SULFATE (5 MG/ML) 0.5% IN NEBU
5.0000 mg | INHALATION_SOLUTION | Freq: Once | RESPIRATORY_TRACT | Status: AC
  Administered 2012-01-04: 5 mg via RESPIRATORY_TRACT
  Filled 2012-01-04: qty 1

## 2012-01-04 MED ORDER — ALBUTEROL SULFATE HFA 108 (90 BASE) MCG/ACT IN AERS: 2.0000 | INHALATION_SPRAY | RESPIRATORY_TRACT | Status: DC | PRN

## 2012-01-04 MED ORDER — DOXYCYCLINE HYCLATE 100 MG PO CAPS: 100.0000 mg | ORAL_CAPSULE | Freq: Two times a day (BID) | ORAL | Status: DC

## 2012-01-04 MED ORDER — ASPIRIN 81 MG PO CHEW
324.0000 mg | CHEWABLE_TABLET | Freq: Once | ORAL | Status: AC
Start: 2012-01-04 — End: 2012-01-04
  Administered 2012-01-04: 324 mg via ORAL
  Filled 2012-01-04: qty 4

## 2012-01-04 NOTE — ED Notes (Signed)
Pt presents to department for evaluation of SOB, dry cough, and lower extremity edema. Ongoing for several weeks. Denies pain at the time. Respirations unlabored. She is alert and oriented x4. No signs of acute distress at the time.

## 2012-01-04 NOTE — ED Notes (Signed)
Patient transported to X-ray 

## 2012-01-04 NOTE — ED Provider Notes (Signed)
History     CSN: [id number]  Arrival date & time 01/04/12  1152   First MD Initiated Contact with Patient 01/04/12 1204      Chief Complaint  Patient presents with  . Shortness of Breath  . 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 She states compliance with her medications and diet. She denies any chest pain or fever. She has good urine output. She sleeps on 4 pillows at night at baseline but has recently switched to a recliner. She denies any leg swelling though she had some last week.  She does wake up at night short of breath. She became short of breath walking from to the hospital from the car. She was supposed to followup with cardiology October 16 but she states her appointment was canceled.  The history is provided by the patient and the spouse.    Past Medical History  Diagnosis Date  . CHF (congestive heart failure)     Past Surgical History  Procedure Date  . Abdominal hysterectomy     No family history on file.  History  Substance Use Topics  . Smoking status: Never Smoker   . Smokeless tobacco: Not on file  . Alcohol Use: No    OB History    Grav Para Term Preterm Abortions TAB SAB Ect Mult Living                  Review of Systems  Constitutional: Positive for activity change and appetite change. Negative for fever and fatigue.  HENT: Negative for congestion and rhinorrhea.   Respiratory: Positive for shortness of breath. Negative for cough and chest tightness.   Cardiovascular: Negative for chest pain.  Gastrointestinal: Negative for nausea, vomiting and abdominal pain.  Genitourinary: Negative for dysuria and hematuria.  Musculoskeletal: Negative for back pain.  Skin: Negative for rash.  Neurological: Negative for dizziness,  weakness and headaches.    Allergies  Review of patient's allergies indicates no known allergies.  Home Medications   Current Outpatient Rx  Name Route Sig Dispense Refill  . ASPIRIN 81 MG PO TABS Oral Take 81 mg by mouth daily.    Marland Kitchen BENZONATATE 200 MG PO CAPS Oral Take 200 mg by mouth daily as needed.    Marland Kitchen CETIRIZINE HCL 10 MG PO TABS Oral Take 10 mg by mouth daily as needed.    . FUROSEMIDE 40 MG PO TABS Oral Take 20 mg by mouth daily.     Marland Kitchen LOPERAMIDE HCL 2 MG PO CAPS Oral Take 2 mg by mouth 4 (four) times daily as needed.    Marland Kitchen LOSARTAN POTASSIUM 50 MG PO TABS Oral Take 50 mg by mouth daily.    Marland Kitchen METOPROLOL TARTRATE 100 MG PO TABS Oral Take 50 mg by mouth 2 (two) times daily.    . ALBUTEROL SULFATE HFA 108 (90 BASE) MCG/ACT IN AERS Inhalation Inhale 2 puffs into the lungs every 4 (four) hours as needed for wheezing. 1 each 0  . DOXYCYCLINE HYCLATE 100 MG PO CAPS Oral Take 1 capsule (100 mg total) by mouth 2 (two) times daily. 20 capsule 0    BP 128/73  Pulse 58  Temp 97.6 F (36.4 C) (Oral)  Resp 15  SpO2 98%  Physical Exam  Constitutional: She is oriented to person, place, and time. She appears well-developed and well-nourished.  No distress.  HENT:  Head: Normocephalic and atraumatic.  Mouth/Throat: Oropharynx is clear and moist. No oropharyngeal exudate.  Eyes: Conjunctivae normal and EOM are normal. Pupils are equal, round, and reactive to light.  Neck: Normal range of motion. Neck supple.  Cardiovascular: Normal rate, regular rhythm and normal heart sounds.   No murmur heard. Pulmonary/Chest: Effort normal and breath sounds normal. No respiratory distress.       Decreased breath sounds at bases  Abdominal: Soft. There is no tenderness. There is no rebound and no guarding.  Musculoskeletal: Normal range of motion. She exhibits no edema and no tenderness.  Neurological: She is alert and oriented to person, place, and time. No cranial nerve deficit.  Skin: Skin is warm.      ED Course  Procedures (including critical care time)  Labs Reviewed  CBC WITH DIFFERENTIAL - Abnormal; Notable for the following:    WBC 11.0 (*)     All other components within normal limits  COMPREHENSIVE METABOLIC PANEL  TROPONIN I  PRO B NATRIURETIC PEPTIDE  URINALYSIS, ROUTINE W REFLEX MICROSCOPIC   Dg Chest 2 View  01/04/2012  *RADIOLOGY REPORT*  Clinical Data: Cough, shortness of breath  CHEST - 2 VIEW  Comparison: 04/10/2010  Findings: Borderline cardiomegaly again noted.  No acute infiltrate or pleural effusion.  No pulmonary edema.  Stable mild degenerative changes thoracic spine.  IMPRESSION: No active disease.  Stable mild degenerative changes thoracic spine.   Original Report Authenticated By: Natasha Mead, M.D.      1. CHF (congestive heart failure)   2. Bronchitis       MDM  2 weeks of shortness of breath, dry cough, dyspnea on exertion and PND. Vital stable, no distress, no hypoxia No tachycardia or hypoxia to suggest PE.   Does not appear to be significantly volume overloaded on exam. Chest x-ray reviewed. Patient given IV Lasix with good response.  Chest x-ray with minimal interstitial edema. EKG nonischemic, troponin negative BNP normal.  Doubt PE.  Ambulatory in ED without desaturation.  D/w Dr. Anne Fu who agrees with good diuresis patient can be seen as outpatient.   Date: 01/04/2012  Rate: 53  Rhythm: normal sinus rhythm  QRS Axis: normal  Intervals: PR prolonged  ST/T Wave abnormalities: nonspecific ST/T changes  Conduction Disutrbances:none  Narrative Interpretation:   Old EKG Reviewed: unchanged     Glynn Octave, MD 01/04/12 1547

## 2012-01-04 NOTE — ED Notes (Signed)
Monitored pulse oximetry while patient was ambulating. SPO2 96-99% on RA with HR 76-90.  Continues to cough intermittently. NAD noted.

## 2012-01-04 NOTE — ED Notes (Signed)
Pharmacy at bedside

## 2012-03-27 ENCOUNTER — Emergency Department (HOSPITAL_COMMUNITY)
Admission: EM | Admit: 2012-03-27 | Discharge: 2012-03-27 | Disposition: A | Discharge status: Home / Self Care | Payer: Self-pay | Attending: Emergency Medicine | Admitting: Emergency Medicine

## 2012-03-27 ENCOUNTER — Encounter (HOSPITAL_COMMUNITY): Payer: Self-pay | Admitting: *Deleted

## 2012-03-27 ENCOUNTER — Emergency Department (HOSPITAL_COMMUNITY): Payer: Self-pay

## 2012-03-27 DIAGNOSIS — M79603 Pain in arm, unspecified: Secondary | ICD-10-CM

## 2012-03-27 DIAGNOSIS — M79609 Pain in unspecified limb: Secondary | ICD-10-CM | POA: Insufficient documentation

## 2012-03-27 DIAGNOSIS — I509 Heart failure, unspecified: Secondary | ICD-10-CM | POA: Insufficient documentation

## 2012-03-27 DIAGNOSIS — I10 Essential (primary) hypertension: Secondary | ICD-10-CM | POA: Insufficient documentation

## 2012-03-27 DIAGNOSIS — Z7982 Long term (current) use of aspirin: Secondary | ICD-10-CM | POA: Insufficient documentation

## 2012-03-27 DIAGNOSIS — Z79899 Other long term (current) drug therapy: Secondary | ICD-10-CM | POA: Insufficient documentation

## 2012-03-27 HISTORY — DX: Essential (primary) hypertension: I10

## 2012-03-27 LAB — CBC WITH DIFFERENTIAL/PLATELET
Basophils Relative: 0 % (ref 0–1)
Eosinophils Absolute: 0.1 10*3/uL (ref 0.0–0.7)
HCT: 40.3 % (ref 36.0–46.0)
Hemoglobin: 13.6 g/dL (ref 12.0–15.0)
Lymphs Abs: 2.6 10*3/uL (ref 0.7–4.0)
MCH: 29.6 pg (ref 26.0–34.0)
MCHC: 33.7 g/dL (ref 30.0–36.0)
Monocytes Absolute: 0.5 10*3/uL (ref 0.1–1.0)
Monocytes Relative: 7 % (ref 3–12)
Neutro Abs: 3.5 10*3/uL (ref 1.7–7.7)
RBC: 4.6 MIL/uL (ref 3.87–5.11)

## 2012-03-27 LAB — BASIC METABOLIC PANEL
BUN: 14 mg/dL (ref 6–23)
Chloride: 103 mEq/L (ref 96–112)
Creatinine, Ser: 0.76 mg/dL (ref 0.50–1.10)
GFR calc Af Amer: >90 mL/min (ref >90)
Glucose, Bld: 95 mg/dL (ref 70–99)
Potassium: 4.1 mEq/L (ref 3.5–5.1)

## 2012-03-27 MED ORDER — METOPROLOL TARTRATE 100 MG PO TABS: 50.0000 mg | ORAL_TABLET | Freq: Two times a day (BID) | ORAL | Status: DC

## 2012-03-27 MED ORDER — SPIRONOLACTONE 25 MG PO TABS: 25.0000 mg | ORAL_TABLET | Freq: Every day | ORAL | Status: DC

## 2012-03-27 MED ORDER — FUROSEMIDE 40 MG PO TABS: 20.0000 mg | ORAL_TABLET | Freq: Every day | ORAL | Status: DC

## 2012-03-27 NOTE — ED Notes (Signed)
Returned from xray

## 2012-03-27 NOTE — ED Notes (Signed)
Pt reports left shoulder and arm pain since yesterday, denies injury. Having a constant pain down her arm. Denies any cp or sob. Has been out of bp and chf meds.

## 2012-03-27 NOTE — ED Notes (Signed)
Care transferred, report received K[place], RN. 

## 2012-03-27 NOTE — ED Notes (Signed)
Patient transported to X-ray 

## 2012-03-27 NOTE — ED Provider Notes (Signed)
History     CSN: [id number]  Arrival date & time 03/27/12  4098   First MD Initiated Contact with Patient 03/27/12 [phone]      Chief Complaint  Patient presents with  . Arm Pain    (Consider location/radiation/quality/duration/timing/severity/associated sxs/prior treatment) Patient is a 57 y.o. female presenting with arm pain. The history is provided by the patient.  Arm Pain This is a new problem. Pertinent negatives include no chest pain, no abdominal pain, no headaches and no shortness of breath.   patient's had left shoulder pain going down to her hand since yesterday. As dull and constant. It involves her fingertips now 2. She states it only involves some of the fingers. No dyspnea. No cough. No lightheadedness or dizziness. Patient is out of several of her cardiac medications. No swelling. No confusion. No weakness.  Past Medical History  Diagnosis Date  . CHF (congestive heart failure)   . Hypertension     Past Surgical History  Procedure Date  . Abdominal hysterectomy     History reviewed. No pertinent family history.  History  Substance Use Topics  . Smoking status: Never Smoker   . Smokeless tobacco: Not on file  . Alcohol Use: No    OB History    Grav Para Term Preterm Abortions TAB SAB Ect Mult Living                  Review of Systems  Constitutional: Negative for activity change and appetite change.  HENT: Negative for neck stiffness.   Eyes: Negative for pain.  Respiratory: Negative for chest tightness and shortness of breath.   Cardiovascular: Negative for chest pain and leg swelling.  Gastrointestinal: Negative for nausea, vomiting, abdominal pain and diarrhea.  Genitourinary: Negative for flank pain.  Musculoskeletal: Negative for back pain.  Skin: Negative for rash.  Neurological: Negative for weakness, numbness and headaches.  Psychiatric/Behavioral: Negative for behavioral problems.    Allergies  Review of patient's allergies indicates  no known allergies.  Home Medications   Current Outpatient Rx  Name  Route  Sig  Dispense  Refill  . ALBUTEROL SULFATE HFA 108 (90 BASE) MCG/ACT IN AERS   Inhalation   Inhale 2 puffs into the lungs every 4 (four) hours as needed for wheezing.   1 each   0   . ASPIRIN 81 MG PO TABS   Oral   Take 81 mg by mouth daily.         Marland Kitchen CETIRIZINE HCL 10 MG PO TABS   Oral   Take 10 mg by mouth daily as needed. For allergies         . FUROSEMIDE 40 MG PO TABS   Oral   Take 20 mg by mouth daily.          Marland Kitchen LOPERAMIDE HCL 2 MG PO CAPS   Oral   Take 2 mg by mouth 4 (four) times daily as needed. For loose stool         . LOSARTAN POTASSIUM 50 MG PO TABS   Oral   Take 50 mg by mouth daily.         Marland Kitchen METOPROLOL TARTRATE 100 MG PO TABS   Oral   Take 50 mg by mouth 2 (two) times daily.         Marland Kitchen SPIRONOLACTONE 25 MG PO TABS   Oral   Take 25 mg by mouth daily.           BP 133/80  Pulse 54  Temp 97.8 F (36.6 C) (Oral)  Resp 13  SpO2 95%  Physical Exam  Nursing note and vitals reviewed. Constitutional: She is oriented to person, place, and time. She appears well-developed and well-nourished.  HENT:  Head: Normocephalic and atraumatic.  Eyes: EOM are normal. Pupils are equal, round, and reactive to light.  Neck: Normal range of motion. Neck supple.  Cardiovascular: Normal rate, regular rhythm and normal heart sounds.   No murmur heard. Pulmonary/Chest: Effort normal and breath sounds normal. No respiratory distress. She has no wheezes. She has no rales.  Abdominal: Soft. Bowel sounds are normal. She exhibits no distension. There is no tenderness. There is no rebound and no guarding.  Musculoskeletal: Normal range of motion.       No tenderness of her neck. No tenderness over trapezius.  Neurological: She is alert and oriented to person, place, and time. No cranial nerve deficit.       Sensation intact over left hand. Strength intact in left shoulder elbow wrist  and hand.  Skin: Skin is warm and dry.  Psychiatric: She has a normal mood and affect. Her speech is normal.    ED Course  Procedures (including critical care time)   Labs Reviewed  CBC WITH DIFFERENTIAL  BASIC METABOLIC PANEL  TROPONIN I   Dg Chest 2 View  03/27/2012  *RADIOLOGY REPORT*  Clinical Data: Left shoulder pain, cough.  CHEST - 2 VIEW  Comparison: Chest x-ray 01/04/2012  Findings: Heart is upper limits normal in size.  Lungs are clear. No effusions.  No acute bony abnormality.  Stable mild degenerative spurring in the thoracic spine.  IMPRESSION: No acute cardiopulmonary disease.   Original Report Authenticated By: Charlett Nose, M.D.      1. Arm pain      Date: 03/27/2012  Rate: 75  Rhythm: normal sinus rhythm and premature ventricular contractions (PVC)  QRS Axis: normal  Intervals: normal  ST/T Wave abnormalities: normal  Conduction Disutrbances:none  Narrative Interpretation:   Old EKG Reviewed: unchanged    MDM  Patient presents with left arm pain. Down arm. Patient states it hurts in the median distribution. EKG is reassuring. Lab work x-ray reassuring also. She's been also her medications, however blood pressures still overall pretty good. After discussion with Dr. Eldridge Dace patient restarted back on Lopressor and Aldactone. Losartan will be held and primary care doctor or doctor scans wil  discuss adjusting.       Juliet Rude. Rubin Payor, MD 03/27/12 1300

## 2012-05-24 ENCOUNTER — Ambulatory Visit (INDEPENDENT_AMBULATORY_CARE_PROVIDER_SITE_OTHER): Payer: Self-pay | Admitting: Internal Medicine

## 2012-05-24 ENCOUNTER — Encounter: Payer: Self-pay | Admitting: Internal Medicine

## 2012-05-24 ENCOUNTER — Ambulatory Visit: Payer: Self-pay

## 2012-05-24 VITALS — BP 119/74 | HR 84 | Temp 97.8°F | Ht 62.0 in | Wt 168.0 lb

## 2012-05-24 DIAGNOSIS — J309 Allergic rhinitis, unspecified: Secondary | ICD-10-CM | POA: Insufficient documentation

## 2012-05-24 DIAGNOSIS — I428 Other cardiomyopathies: Secondary | ICD-10-CM

## 2012-05-24 DIAGNOSIS — I10 Essential (primary) hypertension: Secondary | ICD-10-CM | POA: Insufficient documentation

## 2012-05-24 DIAGNOSIS — Z0000 Encounter for general adult medical examination without abnormal findings: Secondary | ICD-10-CM

## 2012-05-24 DIAGNOSIS — I5022 Chronic systolic (congestive) heart failure: Secondary | ICD-10-CM

## 2012-05-24 MED ORDER — METOPROLOL TARTRATE 50 MG PO TABS: 25.0000 mg | ORAL_TABLET | Freq: Two times a day (BID) | ORAL | Status: DC

## 2012-05-24 MED ORDER — LISINOPRIL 5 MG PO TABS: 2.5000 mg | ORAL_TABLET | Freq: Every day | ORAL | Status: DC

## 2012-05-24 MED ORDER — FUROSEMIDE 40 MG PO TABS: 20.0000 mg | ORAL_TABLET | Freq: Every day | ORAL | Status: DC

## 2012-05-24 NOTE — Patient Instructions (Signed)
1.  Start Lisinopril 5 mg tablets.  Take 1/2 tablet daily for your blood pressure and your heart.  2.  Start Metoprolol 50 mg tablets.  Take 1/2 tablet twice daily for your blood pressure and your heart.  3.  Continue the Furosemide (Lasix) 40 mg tablets.  Take 1/2 tablet daily for your blood pressure.  4.  You can continue the Claritin or the other medication we discussed was Zyrtec (Cetirizine) for the allergies.  5.  We will work on getting records from Dr. Judd Gaudier office  6.  Follow up in 2 weeks.  - Make sure you get your orange card taken care of as well before that visit.

## 2012-05-24 NOTE — Progress Notes (Signed)
Subjective:   Patient ID: [name] female   DOB: 1955/09/19 57 y.o.   MRN: [id number]  HPI: [name] is a 57 y.o. woman who presents to clinic today to establish care.  She has a PMH significant for non-ischemic cardiomyopathy, hypertension, chronic systolic heart failure, and allergic rhinitis.    She states that she has been doing well lately but has been out of several of her medications for several months and will be running out of her Lasix at the end of this month.  She previously followed with a Dr. Elvina Mattes of [place], [place] but he has since retired and she had lost her health insurance so she hasn't seen a doctor lately.  She is also followed in town by Dr. Donato Schultz.  She denies any recent orthopnea, increased swelling in her legs, chest pain, shortness of breath, DOE, or cough.  She is able to climb a set of stairs with no dyspnea or chest pain.   She has a family history of her mother with breast cancer but no other cancers that she knows of in her family.  She brother died of a massive heart attack at the age of 40 and her father had a stent placed in his 73s.  She has a living brother with diabetes and her father also has diabetes.    With her doctor in [place], she has had previous mammograms as well as pap smears and states that she has never had an abnormal test of either.  She had a TAH for fibroids around 2000.  They left the ovaries.  Following her surgery she continued to have occasional breast tenderness and bloating but those have stopped for "several years."  She has never undergone Colonoscopy and doesn't think she has ever had a pneumovax or a recent tetanus shot.   Over the past week she has noted itching and increased tearing of her eyes along with a clear nasal drainage.  She has been taking OTC benadryl but it is very sedating for her.  She has a history of "allergies" that usually start in the spring and fall.  She denies cough, fever, chills, nausea, vomiting,  diarrhea, constipation, or sore throat.    Past Medical History  Diagnosis Date  . CHF (congestive heart failure)   . Hypertension   . Chronic diarrhea    Current Outpatient Prescriptions  Medication Sig Dispense Refill  . albuterol (PROVENTIL HFA;VENTOLIN HFA) 108 (90 BASE) MCG/ACT inhaler Inhale 2 puffs into the lungs every 4 (four) hours as needed for wheezing.  1 each  0  . aspirin 81 MG tablet Take 81 mg by mouth daily.      . cetirizine (ZYRTEC) 10 MG tablet Take 10 mg by mouth daily as needed. For allergies      . furosemide (LASIX) 40 MG tablet Take 0.5 tablets (20 mg total) by mouth daily.  30 tablet  3  . lisinopril (PRINIVIL,ZESTRIL) 5 MG tablet Take 0.5 tablets (2.5 mg total) by mouth daily.  30 tablet  6  . loperamide (IMODIUM) 2 MG capsule Take 2 mg by mouth 4 (four) times daily as needed. For loose stool      . metoprolol (LOPRESSOR) 50 MG tablet Take 0.5 tablets (25 mg total) by mouth 2 (two) times daily.  30 tablet  3  . spironolactone (ALDACTONE) 25 MG tablet Take 1 tablet (25 mg total) by mouth daily.  30 tablet  0   No current facility-administered medications for  this visit.   Family History  Problem Relation Age of Onset  . Heart attack Brother 48  . Heart disease Father 56    cath in his 58s.   . Diabetes Father   . Diabetes Brother   . Breast cancer Mother 32   History   Social History  . Marital Status: Married    Spouse Name: N/A    Number of Children: N/A  . Years of Education: N/A   Social History Main Topics  . Smoking status: Never Smoker   . Smokeless tobacco: None  . Alcohol Use: No  . Drug Use: No  . Sexually Active:    Other Topics Concern  . None   Social History Narrative   Married and lives with husband in [place].  2 boys and 1 girl.  Former Associate Professor.  High school graduate with some college but no degree (for education).     Review of Systems: Constitutional: Denies fever, chills, diaphoresis, appetite change and  fatigue.  HEENT: Denies photophobia, eye pain, redness, hearing loss, ear pain, congestion, sore throat, rhinorrhea, sneezing, mouth sores, trouble swallowing, neck pain, neck stiffness and tinnitus.   Respiratory: Denies SOB, DOE, cough, chest tightness,  and wheezing.   Cardiovascular: Denies chest pain, palpitations and leg swelling.  Gastrointestinal: Denies nausea, vomiting, abdominal pain, diarrhea, constipation, blood in stool and abdominal distention.  Genitourinary: Denies dysuria, urgency, frequency, hematuria, flank pain and difficulty urinating.  Musculoskeletal: Denies myalgias, back pain, joint swelling, arthralgias and gait problem.  Skin: Denies pallor, rash and wound.  Neurological: Denies dizziness, seizures, syncope, weakness, light-headedness, numbness and headaches.  Hematological: Denies adenopathy. Easy bruising, personal or family bleeding history  Psychiatric/Behavioral: Denies suicidal ideation, mood changes, confusion, nervousness, sleep disturbance and agitation  Objective:  Physical Exam: Filed Vitals:   05/24/12 1032  BP: 119/74  Pulse: 84  Temp: 97.8 F (36.6 C)  TempSrc: Oral  Height: 5\' 2"  (1.575 m)  Weight: 168 lb (76.204 kg)  SpO2: 97%   Constitutional: Vital signs reviewed.  Patient is a well-developed and well-nourished woman in no acute distress and cooperative with exam. Alert and oriented x3.  Head: Normocephalic and atraumatic Ear: TM normal bilaterally Mouth: no erythema or exudates, MMM Eyes: PERRL, EOMI, conjunctivae normal, No scleral icterus.  Neck: Supple, Trachea midline normal ROM, No JVD, mass, thyromegaly, or carotid bruit present.  Cardiovascular: RRR, S1 normal, S2 normal, no MRG, pulses symmetric and intact bilaterally Pulmonary/Chest: CTAB, no wheezes, rales, or rhonchi Abdominal: Soft. Non-tender, non-distended, bowel sounds are normal, no masses, organomegaly, or guarding present.  GU: no CVA tenderness Musculoskeletal: No  joint deformities, erythema, or stiffness, ROM full and no nontender Hematology: no cervical, inginal, or axillary adenopathy.  Neurological: A&O x3, Strength is normal and symmetric bilaterally, cranial nerve II-XII are grossly intact, no focal motor deficit, sensory intact to light touch bilaterally.  Skin: Warm, dry and intact. No rash, cyanosis, or clubbing.  Psychiatric: Normal mood and affect. speech and behavior is normal. Judgment and thought content normal. Cognition and memory are normal.   Assessment & Plan:

## 2012-05-29 DIAGNOSIS — Z0000 Encounter for general adult medical examination without abnormal findings: Secondary | ICD-10-CM | POA: Insufficient documentation

## 2012-05-29 NOTE — Assessment & Plan Note (Signed)
She is not a candidate for pap smears due to her hysterectomy for benign causes and her health maintenance was updated to reflect that.  Once she gets her Orange card, we will work to get her set up for mammogram, colon cancer screening, as well as her immunizations that are indicated including pneumovax and Tdap.

## 2012-05-29 NOTE — Assessment & Plan Note (Signed)
She has a PMH of non-ischemic cardiomyopathy and is followed by Dr. Donato Schultz.  She hasn't seen him recently because of lack of insurance.  Currently her symptoms are well controlled and we will work on getting her coverage and then getting her hooked back up with Dr. Anne Fu.

## 2012-05-29 NOTE — Assessment & Plan Note (Signed)
BP Readings from Last 3 Encounters:  05/24/12 119/74  03/27/12 133/80  01/04/12 124/74    Lab Results  Component Value Date   NA 139 03/27/2012   K 4.1 03/27/2012   CREATININE 0.76 03/27/2012    Assessment:  Blood pressure control: controlled  Progress toward BP goal:  at goal  Comments: She has well controlled blood pressure even without several of her medications.  We will start back her ACEI Beta blocker and diuretic at this time for her heart failure but at lower doses.    Plan:  Medications:  Restart Metoprolol, Lisinopril, and Lasix.  Educational resources provided: Community education officer tools provided:    Other plans: She will return in 2 weeks for a recheck blood pressure.

## 2012-05-29 NOTE — Assessment & Plan Note (Signed)
She is currently suffering from seasonal allergies.  We will treat with an oral second generation anti-histamine Zyrtec and continue to follow.

## 2012-05-29 NOTE — Assessment & Plan Note (Signed)
Currently NYHA Class I symptoms.  She seems to be doing well.  No recent echo in the system, last in 2010.  We will work on getting her insurance coverage fixed and then she will likely need to go for repeat Echo to see what her current EF is.  We will continue to manage with ACEI, Beta-blocker, and Diuretics as indicated.

## 2012-06-07 ENCOUNTER — Encounter: Payer: Self-pay | Admitting: Internal Medicine

## 2012-06-14 ENCOUNTER — Encounter: Payer: Self-pay | Admitting: Internal Medicine

## 2012-06-14 ENCOUNTER — Ambulatory Visit (INDEPENDENT_AMBULATORY_CARE_PROVIDER_SITE_OTHER): Payer: No Typology Code available for payment source | Admitting: Internal Medicine

## 2012-06-14 ENCOUNTER — Ambulatory Visit: Payer: No Typology Code available for payment source

## 2012-06-14 VITALS — BP 118/68 | HR 65 | Temp 97.9°F | Ht 62.0 in | Wt 167.1 lb

## 2012-06-14 DIAGNOSIS — Z23 Encounter for immunization: Secondary | ICD-10-CM

## 2012-06-14 DIAGNOSIS — I5022 Chronic systolic (congestive) heart failure: Secondary | ICD-10-CM

## 2012-06-14 DIAGNOSIS — Z1211 Encounter for screening for malignant neoplasm of colon: Secondary | ICD-10-CM | POA: Insufficient documentation

## 2012-06-14 DIAGNOSIS — I428 Other cardiomyopathies: Secondary | ICD-10-CM

## 2012-06-14 DIAGNOSIS — I10 Essential (primary) hypertension: Secondary | ICD-10-CM

## 2012-06-14 DIAGNOSIS — Z1231 Encounter for screening mammogram for malignant neoplasm of breast: Secondary | ICD-10-CM

## 2012-06-14 NOTE — Patient Instructions (Signed)
1.  We will work on getting you set up for your mammogram  2.  We will work on getting records for [facility] about the colonoscopy  3.  We will get you set up for your ultrasound to check your heart.  4.  Continue your medications as prescribed.  5.  Follow up in 3 months.

## 2012-06-14 NOTE — Progress Notes (Signed)
Subjective:   Patient ID: [name] female   DOB: September 27, 1955 57 y.o.   MRN: [id number]  HPI: [name] is a 57 y.o. woman who presents to the clinic today for follow up on her chronic medical conditions including nonischemic cardiomyopathy, hypertension, and chronic systolic heart failure. See Problem focused Assessment and Plan for full details of her chronic medical conditions.   She has multiple health maintenance items that are overdue.  Now that she has gotten her orange card we will work on getting them caught up.  These include her screening mammogram, referral for colon cancer screening, Tdap, and pneumovax.  She also is overdue for a repeat echocardiogram.    Past Medical History  Diagnosis Date  . CHF (congestive heart failure)   . Hypertension   . Chronic diarrhea    Current Outpatient Prescriptions  Medication Sig Dispense Refill  . albuterol (PROVENTIL HFA;VENTOLIN HFA) 108 (90 BASE) MCG/ACT inhaler Inhale 2 puffs into the lungs every 4 (four) hours as needed for wheezing.  1 each  0  . aspirin 81 MG tablet Take 81 mg by mouth daily.      . cetirizine (ZYRTEC) 10 MG tablet Take 10 mg by mouth daily as needed. For allergies      . furosemide (LASIX) 40 MG tablet Take 0.5 tablets (20 mg total) by mouth daily.  30 tablet  3  . lisinopril (PRINIVIL,ZESTRIL) 5 MG tablet Take 0.5 tablets (2.5 mg total) by mouth daily.  30 tablet  6  . loperamide (IMODIUM) 2 MG capsule Take 2 mg by mouth 4 (four) times daily as needed. For loose stool      . metoprolol (LOPRESSOR) 50 MG tablet Take 0.5 tablets (25 mg total) by mouth 2 (two) times daily.  30 tablet  3  . spironolactone (ALDACTONE) 25 MG tablet Take 1 tablet (25 mg total) by mouth daily.  30 tablet  0   No current facility-administered medications for this visit.   Family History  Problem Relation Age of Onset  . Heart attack Brother 48  . Heart disease Father 74    cath in his 27s.   . Diabetes Father   . Diabetes  Brother   . Breast cancer Mother 32   History   Social History  . Marital Status: Married    Spouse Name: N/A    Number of Children: N/A  . Years of Education: N/A   Social History Main Topics  . Smoking status: Never Smoker   . Smokeless tobacco: None  . Alcohol Use: No  . Drug Use: No  . Sexually Active:    Other Topics Concern  . None   Social History Narrative   Married and lives with husband in [place].  2 boys and 1 girl.  Former Associate Professor.  High school graduate with some college but no degree (for education).     Review of Systems: A full 12 system ROS is negative except as noted in the HPI and A&P.   Objective:  Physical Exam: Filed Vitals:   06/14/12 1500  BP: 118/68  Pulse: 65  Temp: 97.9 F (36.6 C)  TempSrc: Oral  Height: 5\' 2"  (1.575 m)  Weight: 167 lb 1.6 oz (75.796 kg)  SpO2: 96%   Constitutional: Vital signs reviewed.  Patient is a well-developed and well-nourished woman in no acute distress and cooperative with exam. Alert and oriented x3.  Head: Normocephalic and atraumatic Ear: TM normal bilaterally Mouth: no erythema or  exudates, MMM Eyes: PERRL, EOMI, conjunctivae normal, No scleral icterus.  Neck: Supple, Trachea midline normal ROM, No JVD, mass, thyromegaly, or carotid bruit present.  Cardiovascular: RRR, S1 normal, S2 normal, no MRG, pulses symmetric and intact bilaterally Pulmonary/Chest: CTAB, no wheezes, rales, or rhonchi Abdominal: Soft. Non-tender, non-distended, bowel sounds are normal, no masses, organomegaly, or guarding present.  GU: no CVA tenderness Musculoskeletal: No joint deformities, erythema, or stiffness, ROM full and no nontender Hematology: no cervical, inginal, or axillary adenopathy.  Neurological: A&O x3, Strength is normal and symmetric bilaterally, cranial nerve II-XII are grossly intact, no focal motor deficit, sensory intact to light touch bilaterally.  Skin: Warm, dry and intact. No rash, cyanosis, or  clubbing.  Psychiatric: Normal mood and affect. speech and behavior is normal. Judgment and thought content normal. Cognition and memory are normal.   Assessment & Plan:

## 2012-06-25 ENCOUNTER — Ambulatory Visit (HOSPITAL_COMMUNITY)
Admission: RE | Admit: 2012-06-25 | Discharge: 2012-06-25 | Disposition: A | Discharge status: Home / Self Care | Payer: No Typology Code available for payment source | Source: Ambulatory Visit | Attending: Internal Medicine | Admitting: Internal Medicine

## 2012-06-25 DIAGNOSIS — Z1231 Encounter for screening mammogram for malignant neoplasm of breast: Secondary | ICD-10-CM | POA: Insufficient documentation

## 2012-09-01 NOTE — Assessment & Plan Note (Signed)
Received Tdap today.  

## 2012-09-01 NOTE — Assessment & Plan Note (Signed)
Ordered screening mammogram.

## 2012-09-01 NOTE — Assessment & Plan Note (Signed)
She is back on her blood pressure medications and denies side effects from the medications.    BP Readings from Last 4 Encounters:  06/14/12 118/68  05/24/12 119/74  03/27/12 133/80  01/04/12 124/74   Blood pressure today is well controlled on her current medications.

## 2012-09-01 NOTE — Assessment & Plan Note (Signed)
She is overdue for a recheck on her echocardiogram.  The last we have is from 2010 that showed a markedly decreased EF.  She is currently NYHA stage I and doing well.

## 2012-09-01 NOTE — Assessment & Plan Note (Signed)
Referral to GI today for screening colonoscopy.

## 2012-09-01 NOTE — Assessment & Plan Note (Signed)
Received pneumovax today.

## 2012-09-01 NOTE — Assessment & Plan Note (Signed)
We will order a follow up Echocardiogram.  We will hold her spironolactone at this time as she doesn't meet criteria for its addition anymore.

## 2012-09-23 ENCOUNTER — Other Ambulatory Visit: Payer: Self-pay | Admitting: Internal Medicine

## 2012-09-23 NOTE — Telephone Encounter (Signed)
Pls schedule next 1-3 months with PCP

## 2012-11-26 ENCOUNTER — Ambulatory Visit (INDEPENDENT_AMBULATORY_CARE_PROVIDER_SITE_OTHER): Payer: No Typology Code available for payment source | Admitting: Internal Medicine

## 2012-11-26 ENCOUNTER — Encounter: Payer: Self-pay | Admitting: Internal Medicine

## 2012-11-26 VITALS — BP 120/71 | HR 62 | Temp 97.4°F | Ht 62.0 in | Wt 161.4 lb

## 2012-11-26 DIAGNOSIS — Z1231 Encounter for screening mammogram for malignant neoplasm of breast: Secondary | ICD-10-CM

## 2012-11-26 DIAGNOSIS — R000 Tachycardia, unspecified: Secondary | ICD-10-CM

## 2012-11-26 DIAGNOSIS — Z0000 Encounter for general adult medical examination without abnormal findings: Secondary | ICD-10-CM

## 2012-11-26 DIAGNOSIS — I5022 Chronic systolic (congestive) heart failure: Secondary | ICD-10-CM

## 2012-11-26 DIAGNOSIS — I10 Essential (primary) hypertension: Secondary | ICD-10-CM

## 2012-11-26 DIAGNOSIS — Z1211 Encounter for screening for malignant neoplasm of colon: Secondary | ICD-10-CM

## 2012-11-26 DIAGNOSIS — I428 Other cardiomyopathies: Secondary | ICD-10-CM

## 2012-11-26 LAB — LIPID PANEL
Cholesterol: 203 mg/dL — ABNORMAL HIGH (ref 0–200)
HDL: 48 mg/dL (ref >39)
LDL Cholesterol: 99 mg/dL (ref 0–99)
Total CHOL/HDL Ratio: 4.2 Ratio
Triglycerides: 280 mg/dL — ABNORMAL HIGH (ref <150)
VLDL: 56 mg/dL — ABNORMAL HIGH (ref 0–40)

## 2012-11-26 MED ORDER — LISINOPRIL 5 MG PO TABS: 2.5000 mg | ORAL_TABLET | Freq: Every day | ORAL | Status: DC

## 2012-11-26 MED ORDER — METOPROLOL TARTRATE 50 MG PO TABS: ORAL_TABLET | ORAL | Status: DC

## 2012-11-26 MED ORDER — FUROSEMIDE 40 MG PO TABS: 20.0000 mg | ORAL_TABLET | Freq: Every day | ORAL | Status: DC

## 2012-11-26 NOTE — Assessment & Plan Note (Signed)
  BP Readings from Last 3 Encounters:  11/26/12 120/71  06/14/12 118/68  05/24/12 119/74    Lab Results  Component Value Date   NA 139 03/27/2012   K 4.1 03/27/2012   CREATININE 0.76 03/27/2012    Assessment: Blood pressure control:   Progress toward BP goal:    Comments: Well controlled on current regimen  Plan: Medications:  continue current medications Educational resources provided: brochure Self management tools provided: home blood pressure logbook Other plans: Checking lipid panel.

## 2012-11-26 NOTE — Assessment & Plan Note (Signed)
Completed earlier this year and reviewed results with patient. No evidence of acute problems.

## 2012-11-26 NOTE — Assessment & Plan Note (Signed)
Patient states that she completed this 3 years ago with [facility]. Will contact [facility] to see if records are available.

## 2012-11-26 NOTE — Assessment & Plan Note (Addendum)
A:  The patient has done well since her last appointment in April. She continues to have exertional dyspnea and orthopnea, but these are at the same level as her baseline. She denies chest pain, LE swelling, and worsening SOB. She is compliant with her medications.  P:  Continue current medications: Lisinopril, Metoprolol, Lasix as before. Checking lipid panel today.

## 2012-11-26 NOTE — Patient Instructions (Addendum)
Continue your current medications as before.  I have ordered an echocardiogram, and the clinic will contact you with a time when it is scheduled.  I have ordered a lipid panel and will follow up with you accordingly.  Please see me for a follow up appointment in 3 months.

## 2012-11-26 NOTE — Assessment & Plan Note (Signed)
Refused flu vaccine.

## 2012-11-26 NOTE — Progress Notes (Signed)
Subjective:   Patient ID: [name] female   DOB: 03-29-1955 57 y.o.   MRN: [id number]  HPI: [name] is a 57 y.o. woman with a pmhx detailed below who comes to the clinic for regular f/u of her chronic medical problems. See problem based charting below.    Past Medical History  Diagnosis Date  . CHF (congestive heart failure)   . Hypertension   . Chronic diarrhea    Current Outpatient Prescriptions  Medication Sig Dispense Refill  . albuterol (PROVENTIL HFA;VENTOLIN HFA) 108 (90 BASE) MCG/ACT inhaler Inhale 2 puffs into the lungs every 4 (four) hours as needed for wheezing.  1 each  0  . aspirin 81 MG tablet Take 81 mg by mouth daily.      . cetirizine (ZYRTEC) 10 MG tablet Take 10 mg by mouth daily as needed. For allergies      . furosemide (LASIX) 40 MG tablet Take 0.5 tablets (20 mg total) by mouth daily.  30 tablet  3  . lisinopril (PRINIVIL,ZESTRIL) 5 MG tablet Take 0.5 tablets (2.5 mg total) by mouth daily.  30 tablet  6  . loperamide (IMODIUM) 2 MG capsule Take 2 mg by mouth 4 (four) times daily as needed. For loose stool      . metoprolol (LOPRESSOR) 50 MG tablet TAKE ONE-HALF TABLET BY MOUTH TWICE DAILY  30 tablet  2   No current facility-administered medications for this visit.   Family History  Problem Relation Age of Onset  . Heart attack Brother 48  . Heart disease Father 32    cath in his 47s.   . Diabetes Father   . Diabetes Brother   . Breast cancer Mother 70   History   Social History  . Marital Status: Married    Spouse Name: N/A    Number of Children: N/A  . Years of Education: N/A   Social History Main Topics  . Smoking status: Never Smoker   . Smokeless tobacco: None  . Alcohol Use: No  . Drug Use: No  . Sexual Activity:    Other Topics Concern  . None   Social History Narrative   Married and lives with husband in [place].  2 boys and 1 girl.  Former Associate Professor.  High school graduate with some college but no degree  (for education).     Review of Systems: Review of Systems  Constitutional: Negative for fever, chills and diaphoresis.  HENT: Negative for sore throat.   Eyes: Negative for blurred vision, double vision and pain.  Respiratory: Positive for cough and shortness of breath. Negative for wheezing.   Cardiovascular: Positive for orthopnea. Negative for chest pain, palpitations and leg swelling.  Gastrointestinal: Negative for nausea, vomiting, diarrhea, blood in stool and melena.  Genitourinary: Negative for dysuria, urgency and frequency.  Skin: Negative for itching and rash.  Neurological: Negative for dizziness, tingling, tremors, weakness and headaches.  Psychiatric/Behavioral: Negative for depression, suicidal ideas, hallucinations and substance abuse. The patient is not nervous/anxious.     Objective:  Physical Exam: Filed Vitals:   11/26/12 1319  BP: 120/71  Pulse: 62  Temp: 97.4 F (36.3 C)  TempSrc: Oral  Height: 5\' 2"  (1.575 m)  Weight: 161 lb 6.4 oz (73.211 kg)  SpO2: 99%   Physical Exam  Constitutional: She is oriented to person, place, and time. She appears well-developed and well-nourished. No distress.  HENT:  Head: Normocephalic and atraumatic.  Eyes: EOM are normal. Pupils are  equal, round, and reactive to light.  Neck: Normal range of motion. Neck supple. No thyromegaly present.  Cardiovascular: Normal rate, regular rhythm, normal heart sounds and intact distal pulses.  Exam reveals no gallop and no friction rub.   No murmur heard. Pulmonary/Chest: Effort normal and breath sounds normal. No respiratory distress. She has no wheezes. She has no rales.  Abdominal: Soft. Bowel sounds are normal. She exhibits no distension. There is no tenderness. There is no rebound and no guarding.  Musculoskeletal: She exhibits no edema and no tenderness.  Lymphadenopathy:    She has no cervical adenopathy.  Neurological: She is oriented to person, place, and time.  Skin: Skin is  warm and dry. She is not diaphoretic.  Psychiatric: She has a normal mood and affect. Her behavior is normal.    Assessment & Plan:

## 2012-11-28 NOTE — Progress Notes (Signed)
I saw and evaluated the patient.  I personally confirmed the key portions of the history and exam documented by Dr. Komansky and I reviewed pertinent patient test results.  The assessment, diagnosis, and plan were formulated together and I agree with the documentation in the resident's note.  

## 2012-12-04 ENCOUNTER — Ambulatory Visit (HOSPITAL_COMMUNITY): Payer: No Typology Code available for payment source

## 2012-12-05 ENCOUNTER — Ambulatory Visit (HOSPITAL_COMMUNITY)
Admission: RE | Admit: 2012-12-05 | Discharge: 2012-12-05 | Disposition: A | Discharge status: Home / Self Care | Payer: No Typology Code available for payment source | Source: Ambulatory Visit | Attending: Internal Medicine | Admitting: Internal Medicine

## 2012-12-05 DIAGNOSIS — I5022 Chronic systolic (congestive) heart failure: Secondary | ICD-10-CM

## 2012-12-05 DIAGNOSIS — I428 Other cardiomyopathies: Secondary | ICD-10-CM | POA: Insufficient documentation

## 2012-12-05 NOTE — Progress Notes (Signed)
  Echocardiogram 2D Echocardiogram has been performed.  [name] 12/05/2012, 11:48 AM

## 2013-01-09 ENCOUNTER — Other Ambulatory Visit: Payer: Self-pay

## 2013-02-17 ENCOUNTER — Ambulatory Visit: Payer: Self-pay

## 2013-04-07 ENCOUNTER — Other Ambulatory Visit: Payer: Self-pay | Admitting: Internal Medicine

## 2013-06-25 ENCOUNTER — Telehealth: Payer: Self-pay | Admitting: *Deleted

## 2013-06-25 NOTE — Telephone Encounter (Signed)
[facility] located at [address], [place], [place] [address] ph#  [phone].  Pt is currently in their office c/o dental pain and physician would like to perform a dental extraction but needs medical clearance  prior to dental work.  Office also wanted to know if pt needed to be "pre-medicated" prior to extraction.   Office was informed that a verbal consent could not be given at this time and to inform pt that she will need to be seen in the office to make that decision.  Discussed with triage, pt given an appt for tomorrow to address medical clearance .Darlene C Goldston4/22/20153:06 PM

## 2013-06-26 ENCOUNTER — Encounter: Payer: Self-pay | Admitting: Internal Medicine

## 2013-06-26 ENCOUNTER — Ambulatory Visit (INDEPENDENT_AMBULATORY_CARE_PROVIDER_SITE_OTHER): Payer: Self-pay | Admitting: Internal Medicine

## 2013-06-26 VITALS — BP 121/68 | HR 68 | Temp 96.5°F | Ht 62.0 in | Wt 163.3 lb

## 2013-06-26 DIAGNOSIS — I10 Essential (primary) hypertension: Secondary | ICD-10-CM

## 2013-06-26 DIAGNOSIS — I5022 Chronic systolic (congestive) heart failure: Secondary | ICD-10-CM

## 2013-06-26 DIAGNOSIS — I509 Heart failure, unspecified: Secondary | ICD-10-CM

## 2013-06-26 MED ORDER — FUROSEMIDE 40 MG PO TABS: ORAL_TABLET | ORAL | Status: DC

## 2013-06-26 NOTE — Progress Notes (Signed)
Subjective:   Patient ID: [name] female   DOB: 26-Nov-1955 58 y.o.   MRN: [id number]  HPI: [name] is a 58 y.o. with PMH significant for chronic systolic heart failure, HTN comes to the office for medical clearance for dental extraction.  Clinic received a call from the patients dentist office yesterday asking for medical clearance and recommendation if she would need preoperative antibiotics.   Patient denies SOB, orthopnea, PND, chest pain, palpitations,  swelling of legs or any other complaints during this office visit. Patient reports that she does all the household work everyday without feeling short of breath.  She reports right lower teeth pain for the past several months and has been advised teeth removal.  Past Medical History  Diagnosis Date  . CHF (congestive heart failure)   . Hypertension   . Chronic diarrhea    Current Outpatient Prescriptions  Medication Sig Dispense Refill  . albuterol (PROVENTIL HFA;VENTOLIN HFA) 108 (90 BASE) MCG/ACT inhaler Inhale 2 puffs into the lungs every 4 (four) hours as needed for wheezing.  1 each  0  . aspirin 81 MG tablet Take 81 mg by mouth daily.      . cetirizine (ZYRTEC) 10 MG tablet Take 10 mg by mouth daily as needed. For allergies      . furosemide (LASIX) 40 MG tablet Take 0.5 tablets (20 mg total) by mouth daily.  30 tablet  6  . lisinopril (PRINIVIL,ZESTRIL) 5 MG tablet Take 0.5 tablets (2.5 mg total) by mouth daily.  30 tablet  6  . loperamide (IMODIUM) 2 MG capsule Take 2 mg by mouth 4 (four) times daily as needed. For loose stool      . metoprolol (LOPRESSOR) 50 MG tablet TAKE ONE-HALF TABLET BY MOUTH TWICE DAILY  30 tablet  6   No current facility-administered medications for this visit.   Family History  Problem Relation Age of Onset  . Heart attack Brother 48  . Heart disease Father 3060    cath in his 6060s.   . Diabetes Father   . Diabetes Brother   . Breast cancer Mother 8655   History   Social  History  . Marital Status: Married    Spouse Name: N/A    Number of Children: N/A  . Years of Education: N/A   Social History Main Topics  . Smoking status: Never Smoker   . Smokeless tobacco: None  . Alcohol Use: No  . Drug Use: No  . Sexual Activity: Yes   Other Topics Concern  . None   Social History Narrative   Married and [place].  2 boys and 1 girl.  Former Associate Professorretail worker.  High school graduate with some college but no degree (for education).     Review of Systems: As per HPI.  Objective:  Physical Exam: Filed Vitals:   06/26/13 0940  BP: 121/68  Pulse: 68  Temp: 96.5 F (35.8 C)  TempSrc: Oral  Height: 5\' 2"  (1.575 m)  Weight: 163 lb 4.8 oz (74.072 kg)  SpO2: 96%   Constitutional: Vital signs reviewed.  Patient is a well-developed and well-nourished and in no acute distress and cooperative with exam.  Alert and oriented x3.  Nose: No erythema or drainage noted.  Turbinates normal Mouth: No erythema or exudates, MMM. Decayed right lower premolar teeth. Neck: Supple, Trachea midline normal ROM, No JVD, mass, thyromegaly, or carotid bruit present.  Cardiovascular: RRR, S1 normal, S2 normal, no MRG,  pulses symmetric and intact bilaterally Pulmonary/Chest: normal respiratory effort, CTAB, no wheezes, rales, or rhonchi Abdominal: Soft. Non-tender, non-distended, bowel sounds are normal, no masses, organomegaly, or guarding present.  Neurological: A&O x3, Strength is normal and symmetric bilaterally, cranial nerve II-XII are grossly intact, no focal motor deficit, sensory intact to light touch bilaterally.  Skin: Warm, dry and intact. No rash, cyanosis, or clubbing. No pedal edema noted. Psychiatric: Normal mood and affect. speech and behavior is normal. Judgment and thought content normal. Cognition and memory are normal.    Assessment & Plan:

## 2013-06-26 NOTE — Patient Instructions (Addendum)
Check your weights daily and keep an algorithm. Start taking Lasix as needed for shortness of breath of if you notice any leg swelling of if you notice a weight gain of more than 2 pounds. Take all the other medications as advised. You are at low risk for dental extraction and you do need prior antibiotic therapy for dental extraction. If you have any questions or concerns, please give us a call.

## 2013-06-27 NOTE — Assessment & Plan Note (Signed)
Currently asymptomatic with no edema or crackles on exam. Repeat echo in October 2014 showed LVEF 50-55% with no regional wall motion abnormalities. Discussed with Dr. Josem KaufmannKlima regarding further management and medical clearance.  Plans: Patient is at low risk for dental extraction. Patient does not need any pre-operative antibiotics. Recommended PRN lasix instead of scheduled lasix. Advised to check daily weights.  PRN Lasix when patient notices edema or has SOB or if there is a weight gain of >2 lbs in a week. Continue ACEI, BBlocker therapy. Follow up as needed.

## 2013-06-27 NOTE — Assessment & Plan Note (Signed)
Well controlled.  Plans: Continue current medical regimen.

## 2013-06-29 NOTE — Progress Notes (Signed)
Case discussed with Dr. Boggala at time of visit.  We reviewed the resident's history and exam and pertinent patient test results.  I agree with the assessment, diagnosis, and plan of care documented in the resident's note. 

## 2013-11-19 ENCOUNTER — Other Ambulatory Visit: Payer: Self-pay | Admitting: *Deleted

## 2013-11-19 MED ORDER — METOPROLOL TARTRATE 50 MG PO TABS: ORAL_TABLET | ORAL | Status: DC

## 2014-03-30 ENCOUNTER — Emergency Department (INDEPENDENT_AMBULATORY_CARE_PROVIDER_SITE_OTHER)
Admission: EM | Admit: 2014-03-30 | Discharge: 2014-03-30 | Disposition: A | Discharge status: Home / Self Care | Payer: Self-pay | Source: Home / Self Care | Attending: Emergency Medicine | Admitting: Emergency Medicine

## 2014-03-30 ENCOUNTER — Encounter (HOSPITAL_COMMUNITY): Payer: Self-pay | Admitting: Emergency Medicine

## 2014-03-30 DIAGNOSIS — J069 Acute upper respiratory infection, unspecified: Secondary | ICD-10-CM

## 2014-03-30 DIAGNOSIS — B9789 Other viral agents as the cause of diseases classified elsewhere: Principal | ICD-10-CM

## 2014-03-30 MED ORDER — BENZONATATE 100 MG PO CAPS: 100.0000 mg | ORAL_CAPSULE | Freq: Three times a day (TID) | ORAL | Status: DC | PRN

## 2014-03-30 MED ORDER — HYDROCODONE-HOMATROPINE 5-1.5 MG/5ML PO SYRP: 5.0000 mL | ORAL_SOLUTION | Freq: Four times a day (QID) | ORAL | Status: DC | PRN

## 2014-03-30 NOTE — ED Provider Notes (Signed)
CSN: [id number]     Arrival date & time 03/30/14  1035 History   First MD Initiated Contact with Patient 03/30/14 1138     Chief Complaint  Patient presents with  . URI   (Consider location/radiation/quality/duration/timing/severity/associated sxs/prior Treatment) HPI She is a 59 year old woman here for evaluation of cold symptoms. Her symptoms started about 10 days ago. She reports nasal congestion, rhinorrhea, sore throat, cough. She denies any fevers or chills. No nausea or vomiting. She has a history of heart failure, but states her weight is stable. Cough is minimally productive.  Past Medical History  Diagnosis Date  . CHF (congestive heart failure)   . Hypertension   . Chronic diarrhea    Past Surgical History  Procedure Laterality Date  . Abdominal hysterectomy w/ partial vaginactomy  2000    for fibroids  . Cardiac catheterization  2010   Family History  Problem Relation Age of Onset  . Heart attack Brother 48  . Heart disease Father 5160    cath in his 660s.   . Diabetes Father   . Diabetes Brother   . Breast cancer Mother 5655   History  Substance Use Topics  . Smoking status: Never Smoker   . Smokeless tobacco: Not on file  . Alcohol Use: No   OB History    No data available     Review of Systems  Constitutional: Negative for fever and appetite change.  HENT: Positive for congestion, rhinorrhea and sore throat. Negative for sinus pressure and trouble swallowing.   Respiratory: Positive for cough. Negative for shortness of breath.   Gastrointestinal: Negative for nausea, vomiting and diarrhea.  Musculoskeletal: Negative for myalgias.  Neurological: Negative for headaches.    Allergies  Review of patient's allergies indicates no known allergies.  Home Medications   Prior to Admission medications   Medication Sig Start Date End Date Taking? Authorizing Provider  OVER THE COUNTER MEDICATION Over the counter [employer] brand cough and cold   Yes Historical  Provider, MD  albuterol (PROVENTIL HFA;VENTOLIN HFA) 108 (90 BASE) MCG/ACT inhaler Inhale 2 puffs into the lungs every 4 (four) hours as needed for wheezing. 01/04/12   Glynn OctaveStephen Rancour, MD  aspirin 81 MG tablet Take 81 mg by mouth daily.    Historical Provider, MD  benzonatate (TESSALON) 100 MG capsule Take 1 capsule (100 mg total) by mouth 3 (three) times daily as needed for cough. 03/30/14   Charm RingsErin J Evangeline Utley, MD  cetirizine (ZYRTEC) 10 MG tablet Take 10 mg by mouth daily as needed. For allergies    Historical Provider, MD  furosemide (LASIX) 40 MG tablet Take 0.5 tablet as needed if you notice any leg swelling or shortness of breath or if your notice a weight gain of 2 pounds. 06/26/13   Cathlean CowerVijaya Prakash Boggala, MD  HYDROcodone-homatropine [facility](HYCODAN) 5-1.5 MG/5ML syrup Take 5 mLs by mouth every 6 (six) hours as needed for cough. 03/30/14   Charm RingsErin J Jamorris Ndiaye, MD  lisinopril (PRINIVIL,ZESTRIL) 5 MG tablet Take 0.5 tablets (2.5 mg total) by mouth daily. 11/26/12   Bobbye Charlestonhristopher B Komanski, MD  loperamide (IMODIUM) 2 MG capsule Take 2 mg by mouth 4 (four) times daily as needed. For loose stool    Historical Provider, MD  metoprolol (LOPRESSOR) 50 MG tablet TAKE ONE-HALF TABLET BY MOUTH TWICE DAILY 11/19/13   Nischal Narendra, MD   BP 152/94 mmHg  Pulse 83  Temp(Src) 98.1 F (36.7 C) (Oral)  Resp 16  SpO2 95% Physical Exam  Constitutional: She  is oriented to person, place, and time. She appears well-developed and well-nourished. No distress.  HENT:  Head: Normocephalic and atraumatic.  Right Ear: Tympanic membrane normal.  Left Ear: Tympanic membrane normal.  Nose: Mucosal edema and rhinorrhea present. Right sinus exhibits no maxillary sinus tenderness and no frontal sinus tenderness. Left sinus exhibits no maxillary sinus tenderness and no frontal sinus tenderness.  Mouth/Throat: Mucous membranes are not dry. No oropharyngeal exudate or posterior oropharyngeal erythema.  Neck: Neck supple.  Cardiovascular:  Normal rate, regular rhythm and normal heart sounds.  Exam reveals no gallop.   No murmur heard. Pulmonary/Chest: Effort normal and breath sounds normal. No respiratory distress. She has no wheezes. She has no rales.  Lymphadenopathy:    She has no cervical adenopathy.  Neurological: She is alert and oriented to person, place, and time.    ED Course  Procedures (including critical care time) Labs Review Labs Reviewed - No data to display  Imaging Review No results found.   MDM   1. Viral URI with cough    Symptomatic treatment with Tessalon and Hycodan for cough. Recommended continued use of nasal saline spray. No signs of fluid overload on exam. Follow-up as needed.    Charm Rings, MD 03/30/14 1247

## 2014-03-30 NOTE — ED Notes (Signed)
Patient reports a week and a half history of cough, cold, stuffiness.  Patient reports history of congestive heart failure.  Patient reports she cannot lie down to sleep, has been sitting up to sleep for a week.  Patient reports no change in cough.

## 2014-03-30 NOTE — ED Notes (Signed)
Patient being treated in the same room as minor child

## 2014-03-30 NOTE — Discharge Instructions (Signed)
You have a virus.  There is no sign of bronchits, sinus infection, or pneumonia. Continue the nasal saline spray. Use tessalon every 8 hours as needed for cough - [facility]. Use Hycodan at night for cough (this has a narcotic medicine in it so do not drive).  This will likely be more expensive. Follow up as needed.

## 2014-10-07 ENCOUNTER — Other Ambulatory Visit: Payer: Self-pay | Admitting: Internal Medicine

## 2014-10-09 ENCOUNTER — Other Ambulatory Visit: Payer: Self-pay | Admitting: *Deleted

## 2014-10-09 MED ORDER — METOPROLOL TARTRATE 50 MG PO TABS: ORAL_TABLET | ORAL | Status: DC

## 2014-10-09 NOTE — Telephone Encounter (Signed)
Pt scheduled for Monday at 9:30 in clinic.

## 2014-10-09 NOTE — Telephone Encounter (Signed)
Hasn't been seen in > 1 yr. Pls asked her to call and make an appt and then I will refil. Also not taking daily, based on date of last Rx.

## 2014-10-12 ENCOUNTER — Encounter: Payer: Self-pay | Admitting: Internal Medicine

## 2014-10-12 ENCOUNTER — Ambulatory Visit (INDEPENDENT_AMBULATORY_CARE_PROVIDER_SITE_OTHER): Payer: Self-pay | Admitting: Internal Medicine

## 2014-10-12 VITALS — BP 152/67 | HR 57 | Temp 97.8°F | Ht 62.0 in | Wt 166.3 lb

## 2014-10-12 DIAGNOSIS — I5022 Chronic systolic (congestive) heart failure: Secondary | ICD-10-CM

## 2014-10-12 DIAGNOSIS — Z0000 Encounter for general adult medical examination without abnormal findings: Secondary | ICD-10-CM

## 2014-10-12 DIAGNOSIS — I10 Essential (primary) hypertension: Secondary | ICD-10-CM

## 2014-10-12 MED ORDER — LISINOPRIL 5 MG PO TABS: 5.0000 mg | ORAL_TABLET | Freq: Every day | ORAL | Status: DC

## 2014-10-12 MED ORDER — METOPROLOL TARTRATE 50 MG PO TABS: ORAL_TABLET | ORAL | Status: DC

## 2014-10-12 NOTE — Assessment & Plan Note (Signed)
Patient agrees to screening mammography, referral placed to [facility]. She reports having a colonoscopy with [facility] within the past 10 years. Patient not high risk candidate for pneumonia vaccination and not candidate for pap smear.  - Mammography referral placed, follow up results - Obtain colonoscopy records

## 2014-10-12 NOTE — Progress Notes (Signed)
59 year old female with hypertension and systolic heart failure (October 2014 showed LVEF 50-55% with no regional wall motion abnormalities) coming in for routine check up. Last visit 06/26/2013 with Dr Comer Locket. No new symptoms since last visit. No shortness of breath, headaches, chest pain or palpitations, dizziness as per patient. Blood pressure mildly elevated as recorded for the last visit and this one, will re-measure. If elevated, will start Lisinopril 5 mg daily (patient has been on before). Agree with plan that was discussed with Dr Dimple Casey. I saw the patient personally with Dr Dimple Casey and concur with his exam.  Aletta Edouard MD MPH 10/12/2014 10:30 AM

## 2014-10-12 NOTE — Patient Instructions (Signed)
Continue taking medications as prescribed. Keep being physically active when you are able, this is great for your heart health, weight, and blood pressure.  Today we added lisinopril  once daily.  A referral was sent to [facility] to schedule a mammogram for you and call you with the appropriate details.

## 2014-10-12 NOTE — Progress Notes (Signed)
Subjective:   Patient ID: DENIKA KRONE female   DOB: 06-03-1955 59 y.o.   MRN: [id number]  HPI: Ms.Hero A Charpentier is a 59 y.o. woman with significant Hx of TN, chronic systolic heart failure at clinic for medication refill. She has not been seen at clinic since 06/2012, but was seen at the ED in 03/2012 and treated for a vrial URI. She has been feeling well since that time with no new complaints. She takes furosemide  0-1 times per week for trace pedal edema.  See problem based assessment and plans.   Past Medical History  Diagnosis Date  . CHF (congestive heart failure)   . Hypertension   . Chronic diarrhea    Current Outpatient Prescriptions  Medication Sig Dispense Refill  . albuterol (PROVENTIL HFA;VENTOLIN HFA) 108 (90 BASE) MCG/ACT inhaler Inhale 2 puffs into the lungs every 4 (four) hours as needed for wheezing. 1 each 0  . aspirin 81 MG tablet Take 81 mg by mouth daily.    . cetirizine (ZYRTEC) 10 MG tablet Take 10 mg by mouth daily as needed. For allergies    . furosemide (LASIX) 40 MG tablet Take 0.5 tablet as needed if you notice any leg swelling or shortness of breath or if your notice a weight gain of 2 pounds. 30 tablet 6  . lisinopril (PRINIVIL,ZESTRIL) 5 MG tablet Take 1 tablet (5 mg total) by mouth daily. 30 tablet 6  . loperamide (IMODIUM) 2 MG capsule Take 2 mg by mouth 4 (four) times daily as needed. For loose stool    . metoprolol (LOPRESSOR) 50 MG tablet TAKE ONE-HALF TABLET BY MOUTH TWICE DAILY 60 tablet 5  . OVER THE COUNTER MEDICATION Over the counter [facility] brand cough and cold     No current facility-administered medications for this visit.   Family History  Problem Relation Age of Onset  . Heart attack Brother 57  . Heart disease Father 57    cath in his 5s.   . Diabetes Father   . Diabetes Brother   . Breast cancer Mother 77   History   Social History  . Marital Status: Married    Spouse Name: N/A  . Number of Children: N/A  . Years of  Education: N/A   Social History Main Topics  . Smoking status: Never Smoker   . Smokeless tobacco: Not on file  . Alcohol Use: No  . Drug Use: No  . Sexual Activity: Yes   Other Topics Concern  . None   Social History Narrative   Married and lives with husband in [place].  2 boys and 1 girl.  Former Associate Professor.  High school graduate with some college but no degree (for education).     Review of Systems: Review of Systems  Constitutional: Negative for fever and chills.  Eyes: Negative for blurred vision and double vision.  Respiratory: Negative for shortness of breath.   Cardiovascular: Positive for leg swelling. Negative for chest pain and palpitations.  Gastrointestinal: Negative for heartburn and nausea.  Neurological: Negative for headaches.    Objective:  Physical Exam: Filed Vitals:   10/12/14 0954  BP: 152/67  Pulse: 57  Temp: 97.8 F (36.6 C)  TempSrc: Oral  Height:  (1.575 m)  Weight: 166 lb 4.8 oz (75.433 kg)  SpO2: 97%   Physical Examination: General appearance - alert, well appearing, and in no distress and overweight Mouth - mucous membranes moist, pharynx normal without lesions and dental hygiene good  Chest - clear to auscultation, no wheezes, rales or rhonchi, symmetric air entry Heart - normal rate, regular rhythm, normal S1, S2, no murmurs, rubs, clicks or gallops Extremities - no pedal edema noted, intact peripheral pulses Skin - normal coloration and turgor, no rashes, no suspicious skin lesions noted Assessment & Plan:

## 2014-10-12 NOTE — Assessment & Plan Note (Addendum)
BP Readings from Last 3 Encounters:  10/12/14 152/67  03/30/14 152/94  06/26/13 121/68    Lab Results  Component Value Date   NA 139 03/27/2012   K 4.1 03/27/2012   CREATININE 0.76 03/27/2012    Assessment: Blood pressure control: Mildly elevated Progress toward BP goal:  None since last visit Comments: Blood pressure elevated on repeat measurement. Patient was previously on lisinopril, diuretic, and beta-blocker. She is currently on only metoprolol  BID and lasix  PRN for pedal edema. Given a history of systolic heart failure Ms. Lamarche' overall risk would be significantly improved with more aggressive blood pressure control.  Plan: Medications:  Start lisinopril  once daily Educational resources provided: Patient recommended to participate in routine physical activity, to check self blood pressure several times per week until her follow up appointment Self management tools provided: Patient has blood pressure monitor at home Consider repeat Bmet at follow up due to starting ACE-I, consider dose titration based on blood pressure and metabolic findings

## 2015-01-26 ENCOUNTER — Encounter: Payer: Self-pay | Admitting: Student

## 2015-06-01 ENCOUNTER — Other Ambulatory Visit: Payer: Self-pay | Admitting: Internal Medicine

## 2015-06-01 NOTE — Telephone Encounter (Signed)
Last seen 10/12/14 by Dr Dimple Caseyice; no f/u appt had been scheduled.

## 2015-06-03 NOTE — Telephone Encounter (Signed)
She needs follow up appointment for blood pressure recheck.  Thanks, Griffin BasilJennifer Krall, MD  Internal Medicine Teaching Service PGY-2

## 2015-06-04 NOTE — Telephone Encounter (Signed)
Message sent to front office. 

## 2015-06-08 ENCOUNTER — Telehealth: Payer: Self-pay | Admitting: Pulmonary Disease

## 2015-06-08 NOTE — Telephone Encounter (Signed)
APPT. REMINDER CALL, LMTCB °

## 2015-06-09 ENCOUNTER — Ambulatory Visit (INDEPENDENT_AMBULATORY_CARE_PROVIDER_SITE_OTHER): Payer: Self-pay | Admitting: Internal Medicine

## 2015-06-09 ENCOUNTER — Encounter: Payer: Self-pay | Admitting: Internal Medicine

## 2015-06-09 VITALS — BP 139/67 | HR 62 | Temp 97.8°F | Ht 62.0 in | Wt 161.7 lb

## 2015-06-09 DIAGNOSIS — R600 Localized edema: Secondary | ICD-10-CM | POA: Insufficient documentation

## 2015-06-09 DIAGNOSIS — Z0000 Encounter for general adult medical examination without abnormal findings: Secondary | ICD-10-CM

## 2015-06-09 DIAGNOSIS — I10 Essential (primary) hypertension: Secondary | ICD-10-CM

## 2015-06-09 DIAGNOSIS — I5022 Chronic systolic (congestive) heart failure: Secondary | ICD-10-CM

## 2015-06-09 MED ORDER — LISINOPRIL 5 MG PO TABS: 5.0000 mg | ORAL_TABLET | Freq: Every day | ORAL | Status: DC

## 2015-06-09 MED ORDER — METOPROLOL TARTRATE 50 MG PO TABS: ORAL_TABLET | ORAL | Status: DC

## 2015-06-09 MED ORDER — FUROSEMIDE 40 MG PO TABS: ORAL_TABLET | ORAL | Status: DC

## 2015-06-09 NOTE — Patient Instructions (Signed)
Today I refilled your medications with 2 month per refill. We are checking your blood work to monitor your kidney function and cholesterol. If your cholesterol is very high you could benefit from starting medication to lower your risk of heart disease and stroke. I will send the results of these tests to you once they are completed, and call if there is a change in treatment plan based on them.

## 2015-06-09 NOTE — Progress Notes (Signed)
Subjective:   Patient ID: [name] female   DOB: 06/19/1955 60 y.o.   MRN: [id number]  HPI: [name] is a 60 y.o. woman with PMHx as detailed below presenting for follow up of her hypertension and medication refills. She was last seen in August 2016 and started on lisinopril  for mildly uncontrolled hypertension at the time. She was taking this medicine without complication but stopped taking it about 2 weeks ago due to running out. She has not been having any headache or lightheadedness during this interval. She has no dyspnea on exertion and tolerates walking normally throughout the day with no complaints. She has occasional mild pedal edema for which she takes furosemide  PRN averaging 0-1 times per week.  She reports having a cough and rhinitis she attributes to a viral URI recently that is resolving over the past 4-5 days.  See problem based assessment and plan below for additional details.  Past Medical History  Diagnosis Date  . CHF (congestive heart failure) (HCC)   . Hypertension   . Chronic diarrhea    Current Outpatient Prescriptions  Medication Sig Dispense Refill  . albuterol (PROVENTIL HFA;VENTOLIN HFA) 108 (90 BASE) MCG/ACT inhaler Inhale 2 puffs into the lungs every 4 (four) hours as needed for wheezing. 1 each 0  . aspirin 81 MG tablet Take 81 mg by mouth daily.    . cetirizine (ZYRTEC) 10 MG tablet Take 10 mg by mouth daily as needed. For allergies    . furosemide (LASIX) 40 MG tablet Take 0.5 tablet as needed if you notice any leg swelling or shortness of breath. 30 tablet 6  . lisinopril (PRINIVIL,ZESTRIL) 5 MG tablet Take 1 tablet (5 mg total) by mouth daily. 60 tablet 5  . metoprolol (LOPRESSOR) 50 MG tablet TAKE ONE-HALF TABLET BY MOUTH TWICE DAILY 60 tablet 5  . OVER THE COUNTER MEDICATION Over the counter [facility] brand cough and cold     No current facility-administered medications for this visit.   Family History  Problem Relation Age  of Onset  . Heart attack Brother 48  . Heart disease Father 26    cath in his 61s.   . Diabetes Father   . Diabetes Brother   . Breast cancer Mother 37   Social History   Social History  . Marital Status: Married    Spouse Name: [name]  . Number of Children: [name]  . Years of Education: [name]   Social History Main Topics  . Smoking status: Never Smoker   . Smokeless tobacco: None  . Alcohol Use: No  . Drug Use: No  . Sexual Activity: Yes   Other Topics Concern  . None   Social History Narrative   Married and lives with husband in [place].  2 boys and 1 girl.  Former Associate Professor.  High school graduate with some college but no degree (for education).     Review of Systems: Review of Systems  Constitutional: Negative for fever and chills.  Respiratory: Positive for cough. Negative for shortness of breath.   Cardiovascular: Positive for leg swelling. Negative for chest pain.  Gastrointestinal: Positive for diarrhea.  Musculoskeletal: Negative for falls.  Neurological: Negative for dizziness and headaches.    Objective:  Physical Exam: Filed Vitals:   06/09/15 1551 06/09/15 1629  BP: 155/80 139/67  Pulse: 70 62  Temp: 97.8 F (36.6 C)   TempSrc: Oral   Height:  (1.575 m)   Weight: 161 lb 11.2  oz (73.347 kg)   SpO2: 98%    GENERAL- alert, co-operative, NAD HEENT- No JVD, no carotid bruit, no cervical LN enlargement. CARDIAC- RRR, no murmurs, rubs or gallops. RESP- CTAB, no wheezes or crackles. EXTREMITIES- pulse 2+, symmetric, no pedal edema. SKIN- Warm, dry, No rash or lesion. PSYCH- Normal mood and affect, appropriate thought content and speech.  Assessment & Plan:

## 2015-06-10 ENCOUNTER — Other Ambulatory Visit: Payer: Self-pay | Admitting: Internal Medicine

## 2015-06-10 ENCOUNTER — Encounter: Payer: Self-pay | Admitting: Internal Medicine

## 2015-06-10 DIAGNOSIS — Z1231 Encounter for screening mammogram for malignant neoplasm of breast: Secondary | ICD-10-CM

## 2015-06-10 LAB — LIPID PANEL
CHOLESTEROL TOTAL: 201 mg/dL — AB (ref 100–199)
Chol/HDL Ratio: 4.6 ratio units — ABNORMAL HIGH (ref 0.0–4.4)
HDL: 44 mg/dL (ref >39)
LDL CALC: 130 mg/dL — AB (ref 0–99)
Triglycerides: 135 mg/dL (ref 0–149)
VLDL CHOLESTEROL CAL: 27 mg/dL (ref 5–40)

## 2015-06-10 LAB — BMP8+ANION GAP
Anion Gap: 19 mmol/L — ABNORMAL HIGH (ref 10.0–18.0)
BUN / CREAT RATIO: 21 (ref 9–23)
BUN: 14 mg/dL (ref 6–24)
CALCIUM: 9.2 mg/dL (ref 8.7–10.2)
CHLORIDE: 103 mmol/L (ref 96–106)
CO2: 21 mmol/L (ref 18–29)
CREATININE: 0.68 mg/dL (ref 0.57–1.00)
GFR calc non Af Amer: 97 mL/min/{1.73_m2} (ref >59)
GFR, EST AFRICAN AMERICAN: 112 mL/min/{1.73_m2} (ref >59)
Glucose: 90 mg/dL (ref 65–99)
Potassium: 3.9 mmol/L (ref 3.5–5.2)
Sodium: 143 mmol/L (ref 134–144)

## 2015-06-10 NOTE — Assessment & Plan Note (Signed)
She is overdue for screening mammography since 06/2014. She has a family history of breast cancer in her mother at age 58. She was Referred last year but  Due to her constellation of HTN, overweight, and previously high cholesterol will check a lipid panel and if she is high ASCVD risk consider starting treatment with a statin.  -Lipid profile -Referral for mammography

## 2015-06-10 NOTE — Assessment & Plan Note (Signed)
No edema on examination today. She has occasional mild swelling for which she takes lasix PRN, averaging 0-1 doses per week. There was some pervious concern for volume overload related to NICM but she no longer really has systolic heart failure with last TTE showing LVEF 50-55% in 2014. No evidence of volume overload any time I have seen her.  Continue PRN lasix

## 2015-06-10 NOTE — Assessment & Plan Note (Addendum)
BP Readings from Last 3 Encounters:  06/09/15 139/67  10/12/14 152/67  03/30/14 152/94    Lab Results  Component Value Date   NA 143 06/09/2015   K 3.9 06/09/2015   CREATININE 0.68 06/09/2015    Assessment: Blood pressure control: Controlled Progress toward BP goal:  Improving Comments: Some improvement in blood pressure with starting a very small dose of lisinopril (5mg ) since her past visit. Now on metoprolol 50mg  and lisinopril 5mg .  Plan: Medications:  continue current medications Educational resources provided: brochure (denies need )  Other plans: Metabolic panel today as she has not been checked since 2014 and started a low dose ACE-I.

## 2015-06-11 NOTE — Progress Notes (Signed)
Internal Medicine Clinic Attending  Case discussed with Dr. Rice at the time of the visit.  We reviewed the resident's history and exam and pertinent patient test results.  I agree with the assessment, diagnosis, and plan of care documented in the resident's note.  

## 2015-11-18 ENCOUNTER — Other Ambulatory Visit: Payer: Self-pay | Admitting: *Deleted

## 2015-11-18 MED ORDER — METOPROLOL TARTRATE 50 MG PO TABS: ORAL_TABLET | ORAL | 6 refills | Status: DC

## 2016-07-14 ENCOUNTER — Other Ambulatory Visit: Payer: Self-pay | Admitting: Internal Medicine

## 2016-12-13 ENCOUNTER — Ambulatory Visit (INDEPENDENT_AMBULATORY_CARE_PROVIDER_SITE_OTHER): Payer: BLUE CROSS/BLUE SHIELD | Admitting: Internal Medicine

## 2016-12-13 VITALS — BP 157/79 | HR 64 | Temp 98.1°F | Wt 163.4 lb

## 2016-12-13 DIAGNOSIS — J309 Allergic rhinitis, unspecified: Secondary | ICD-10-CM

## 2016-12-13 DIAGNOSIS — I5022 Chronic systolic (congestive) heart failure: Secondary | ICD-10-CM | POA: Diagnosis not present

## 2016-12-13 DIAGNOSIS — I10 Essential (primary) hypertension: Secondary | ICD-10-CM

## 2016-12-13 DIAGNOSIS — I110 Hypertensive heart disease with heart failure: Secondary | ICD-10-CM | POA: Diagnosis not present

## 2016-12-13 MED ORDER — ALBUTEROL SULFATE HFA 108 (90 BASE) MCG/ACT IN AERS: 2.0000 | INHALATION_SPRAY | RESPIRATORY_TRACT | 0 refills | Status: DC | PRN

## 2016-12-13 MED ORDER — METOPROLOL TARTRATE 50 MG PO TABS: ORAL_TABLET | ORAL | 3 refills | Status: DC

## 2016-12-13 MED ORDER — LISINOPRIL 5 MG PO TABS: 5.0000 mg | ORAL_TABLET | Freq: Every day | ORAL | 3 refills | Status: DC

## 2016-12-13 MED ORDER — FUROSEMIDE 20 MG PO TABS: 20.0000 mg | ORAL_TABLET | ORAL | 1 refills | Status: DC | PRN

## 2016-12-13 MED ORDER — ALBUTEROL SULFATE HFA 108 (90 BASE) MCG/ACT IN AERS
2.0000 | INHALATION_SPRAY | RESPIRATORY_TRACT | 0 refills | Status: DC | PRN
Start: 2016-12-13 — End: 2016-12-13

## 2016-12-13 NOTE — Progress Notes (Signed)
   CC: Blood pressure follow-up   HPI:  [name] is a 61 y.o. female with past medical history outlined below here for blood pressure follow-up. For the details of today's visit, please refer to the assessment and plan.  Past Medical History:  Diagnosis Date  . CHF (congestive heart failure) (HCC)   . Chronic diarrhea   . Hypertension     Review of Systems  Eyes: Negative for blurred vision.  Neurological: Positive for headaches. Negative for sensory change and focal weakness.    Physical Exam:  Vitals:   12/13/16 0848  BP: (!) 157/79  Pulse: 64  Temp: 98.1 F (36.7 C)  TempSrc: Oral  SpO2: 98%  Weight: 163 lb 6.4 oz (74.1 kg)    Constitutional: NAD, appears comfortable Cardiovascular: RRR, no murmurs, rubs, or gallops.  Pulmonary/Chest: CTAB, no wheezes, rales, or rhonchi.  Extremities: Warm and well perfused. No edema.  Psychiatric: Normal mood and affect  Assessment & Plan:   See Encounters Tab for problem based charting.  Patient discussed with Dr. Oswaldo Done

## 2016-12-13 NOTE — Patient Instructions (Signed)
[name],  It was a pleasure to see you today. I have sent refills of your medicine to your pharmacy. Please continue to take as previously prescribed. Please follow up with [place] in 3 months for a blood pressure check. If you have any questions or concerns, call our clinic at [phone] or after hours call [phone] and ask for the internal medicine resident on call. Thank you!  - Dr. Antony Contras

## 2016-12-13 NOTE — Progress Notes (Signed)
Internal Medicine Clinic Attending  Case discussed with Dr. Guilloud at the time of the visit.  We reviewed the resident's history and exam and pertinent patient test results.  I agree with the assessment, diagnosis, and plan of care documented in the resident's note.  

## 2016-12-13 NOTE — Assessment & Plan Note (Addendum)
Patient is here for blood pressure follow-up. She ran out of her medications 2 weeks ago. Her refill request was denied because she needed an appointment. Since running out of her medications, she has been asymptomatic aside from a mild intermittent headache. Denies blurry vision, focal weakness, nausea / vomiting. She has a history of systolic heart failure and is prescribed metoprolol 25 mg BID, lisinopril 5 mg daily, and Lasix 20 prn for swelling. Prior to running out of her meds, she reports compliance with metoprolol and lisinopril. Says she has not required the lasix in months.  -- Refilled metoprolol 50 mg, 1/2 tablet BID -- Refilled lisinopril 5 mg daily -- Refilled Lasix 20 mg PRN for swelling  -- Follow-up BMP -- Follow-up 3 months for BP recheck  ADDENDUM: BMP with normal renal function, potassium 4.2. Left voicemail with results.

## 2016-12-13 NOTE — Assessment & Plan Note (Signed)
Refilled albuterol inhaler per request.  

## 2016-12-14 LAB — BMP8+ANION GAP
Anion Gap: 15 mmol/L (ref 10.0–18.0)
BUN/Creatinine Ratio: 13 (ref 9–23)
BUN: 9 mg/dL (ref 6–24)
CALCIUM: 9.1 mg/dL (ref 8.7–10.2)
CO2: 23 mmol/L (ref 20–29)
CREATININE: 0.68 mg/dL (ref 0.57–1.00)
Chloride: 106 mmol/L (ref 96–106)
GFR calc Af Amer: 111 mL/min/{1.73_m2} (ref >59)
GFR, EST NON AFRICAN AMERICAN: 96 mL/min/{1.73_m2} (ref >59)
GLUCOSE: 92 mg/dL (ref 65–99)
Potassium: 4.2 mmol/L (ref 3.5–5.2)
Sodium: 144 mmol/L (ref 134–144)

## 2017-03-24 ENCOUNTER — Emergency Department (HOSPITAL_COMMUNITY)
Admission: EM | Admit: 2017-03-24 | Discharge: 2017-03-24 | Disposition: A | Discharge status: Home / Self Care | Payer: BLUE CROSS/BLUE SHIELD | Attending: Emergency Medicine | Admitting: Emergency Medicine

## 2017-03-24 ENCOUNTER — Emergency Department (HOSPITAL_COMMUNITY): Payer: BLUE CROSS/BLUE SHIELD

## 2017-03-24 ENCOUNTER — Encounter (HOSPITAL_COMMUNITY): Payer: Self-pay | Admitting: Emergency Medicine

## 2017-03-24 ENCOUNTER — Other Ambulatory Visit: Payer: Self-pay

## 2017-03-24 DIAGNOSIS — Z7982 Long term (current) use of aspirin: Secondary | ICD-10-CM | POA: Insufficient documentation

## 2017-03-24 DIAGNOSIS — Z79899 Other long term (current) drug therapy: Secondary | ICD-10-CM | POA: Insufficient documentation

## 2017-03-24 DIAGNOSIS — Z77098 Contact with and (suspected) exposure to other hazardous, chiefly nonmedicinal, chemicals: Secondary | ICD-10-CM | POA: Insufficient documentation

## 2017-03-24 DIAGNOSIS — I110 Hypertensive heart disease with heart failure: Secondary | ICD-10-CM | POA: Insufficient documentation

## 2017-03-24 DIAGNOSIS — I509 Heart failure, unspecified: Secondary | ICD-10-CM | POA: Insufficient documentation

## 2017-03-24 DIAGNOSIS — R05 Cough: Secondary | ICD-10-CM | POA: Insufficient documentation

## 2017-03-24 DIAGNOSIS — J9801 Acute bronchospasm: Secondary | ICD-10-CM

## 2017-03-24 MED ORDER — ALBUTEROL SULFATE (2.5 MG/3ML) 0.083% IN NEBU
2.5000 mg | INHALATION_SOLUTION | Freq: Once | RESPIRATORY_TRACT | Status: AC
  Administered 2017-03-24: 2.5 mg via RESPIRATORY_TRACT
  Filled 2017-03-24: qty 3

## 2017-03-24 MED ORDER — ALBUTEROL SULFATE HFA 108 (90 BASE) MCG/ACT IN AERS: 1.0000 | INHALATION_SPRAY | Freq: Four times a day (QID) | RESPIRATORY_TRACT | 0 refills | Status: DC | PRN

## 2017-03-24 NOTE — ED Triage Notes (Signed)
Pt to ER for evaluation of sore throat after cleaning her bathroom and inhaling chemical cleaner. Pt coughing in triage.

## 2017-03-24 NOTE — Discharge Instructions (Signed)
As we discussed, using albuterol inhaler as directed for bronchospasm.  Make sure you are drinking plenty of fluids and staying hydrated.  Follow-up with your primary care doctor in the next 24-48 hours for further evaluation.  Return to the emergency department for any worsening cough, difficult to breathing, chest pain, difficulty swallowing or any other worsening or concerning symptoms.

## 2017-03-24 NOTE — ED Provider Notes (Signed)
[facility] EMERGENCY DEPARTMENT Provider Note   CSN: [id number] Arrival date & time: 03/24/17  1716     History   Chief Complaint Chief Complaint  Patient presents with  . inhaled chemical    HPI [name] is a 62 y.o. female who presents for evaluation of sore throat and cough after possible inhalant exposure.  Patient reports that today approximate 4 PM, she was cleaning her sink with a generic brand version of Drano.  Patient states that she felt like the fumes coming up from the sink and that she may have been around them for too long.  She states that 20 minutes after cleaning it, she started experiencing some sore throat and coughing which persisted until ED visit.  On ED arrival, patient reports improvement in symptoms.  Patient states that she did not ingest, swallow any of the cleaner.  She states that she did not have any loss of consciousness.  Patient denies any history of asthma or COPD.  She is not a current smoker.  Patient denies any chest pain, difficulty breathing.  The history is provided by the patient.    Past Medical History:  Diagnosis Date  . CHF (congestive heart failure) (HCC)   . Chronic diarrhea   . Hypertension     Patient Active Problem List   Diagnosis Date Noted  . Lower extremity edema 06/09/2015  . Preventative health care 05/29/2012  . Essential hypertension, benign 05/24/2012  . Allergic rhinitis 05/24/2012    Past Surgical History:  Procedure Laterality Date  . ABDOMINAL HYSTERECTOMY W/ PARTIAL VAGINACTOMY  2000   for fibroids  . CARDIAC CATHETERIZATION  2010    OB History    No data available       Home Medications    Prior to Admission medications   Medication Sig Start Date End Date Taking? Authorizing Provider  albuterol (PROVENTIL HFA;VENTOLIN HFA) 108 (90 Base) MCG/ACT inhaler Inhale 1-2 puffs into the lungs every 6 (six) hours as needed for wheezing or shortness of breath. 03/24/17   Maxwell Caul, PA-C  aspirin 81 MG tablet Take 81 mg by mouth daily.    [provider]  cetirizine (ZYRTEC) 10 MG tablet Take 10 mg by mouth daily as needed. For allergies    [provider]  furosemide (LASIX) 20 MG tablet Take 1 tablet (20 mg total) by mouth as needed for edema. Take 0.5 tablet as needed if you notice any leg swelling or shortness of breath. 12/13/16   Reymundo Poll, MD  lisinopril (PRINIVIL,ZESTRIL) 5 MG tablet Take 1 tablet (5 mg total) by mouth daily. 12/13/16   Reymundo Poll, MD  metoprolol tartrate (LOPRESSOR) 50 MG tablet TAKE ONE-HALF TABLET BY MOUTH TWICE DAILY 12/13/16   Reymundo Poll, MD  OVER THE COUNTER MEDICATION Over the counter [employer] brand cough and cold    [provider]    Family History Family History  Problem Relation Age of Onset  . Heart attack Brother 48  . Heart disease Father 42       cath in his 68s.   . Diabetes Father   . Diabetes Brother   . Breast cancer Mother 36    Social History Social History   Tobacco Use  . Smoking status: Never Smoker  Substance Use Topics  . Alcohol use: No    Alcohol/week: 0.0 oz  . Drug use: No     Allergies   Patient has no known allergies.  Review of Systems Review of Systems  HENT: Positive for sore throat.   Respiratory: Positive for cough. Negative for shortness of breath.   Cardiovascular: Negative for chest pain.  Gastrointestinal: Negative for vomiting.     Physical Exam Updated Vital Signs BP (!) 148/77   Pulse 69   Temp 99 F (37.2 C) (Oral)   Resp 18   Ht 5\' 2"  (1.575 m)   Wt 74.8 kg (165 lb)   SpO2 100%   BMI 30.18 kg/m   Physical Exam  Constitutional: She appears well-developed and well-nourished.  HENT:  Head: Normocephalic and atraumatic.  Mouth/Throat: Uvula is midline, oropharynx is clear and moist and mucous membranes are normal.  Posterior oropharynx is clear without any signs of erythema.  Uvula is midline.  No trismus.    Eyes: Conjunctivae and EOM are normal. Right eye exhibits no discharge. Left eye exhibits no discharge. No scleral icterus.  Cardiovascular: Normal rate and regular rhythm.  Pulses:      Carotid pulses are 2+ on the right side, and 2+ on the left side. Pulmonary/Chest: Effort normal and breath sounds normal. No respiratory distress.  No evidence of respiratory distress. Able to speak in full sentences without difficulty.  Neurological: She is alert.  Skin: Skin is warm and dry.  Psychiatric: She has a normal mood and affect. Her speech is normal and behavior is normal.  Nursing note and vitals reviewed.    ED Treatments / Results  Labs (all labs ordered are listed, but only abnormal results are displayed) Labs Reviewed - No data to display  EKG  EKG Interpretation None       Radiology Dg Chest 2 View  Result Date: 03/24/2017 CLINICAL DATA:  Cough after inhaling draino. EXAM: CHEST  2 VIEW COMPARISON:  03/27/2012 FINDINGS: Minimal atelectasis along the left heart border. Cluster of tubular densities over the left upper lobe is best attributed to a ponytail tie, as confirmed with technologist. No edema or effusion. Normal heart size and mediastinal contours. IMPRESSION: Minimal lingular atelectasis.  Otherwise negative and stable. Electronically Signed   By: Marnee Spring M.D.   On: 03/24/2017 18:22    Procedures Procedures (including critical care time)  Medications Ordered in ED Medications  albuterol (PROVENTIL) (2.5 MG/3ML) 0.083% nebulizer solution 2.5 mg (2.5 mg Nebulization Given 03/24/17 1928)     Initial Impression / Assessment and Plan / ED Course  I have reviewed the triage vital signs and the nursing notes.  Pertinent labs & imaging results that were available during my care of the patient were reviewed by me and considered in my medical decision making (see chart for details).     62 y.o. who presents for evaluation of cough after possible chemical  inhalant exposure.  Patient reports that she was cleaning her kitchen sink with generic brand drain no clear.  She states that the area was not but well ventilated and she reports that 20 minutes after cleaning it, she started experiencing some coughing and throat irritation.  Patient states that she did not ingest any of the cleaner.  Patient has no history of asthma or COPD.  On ED arrival, she states  symptoms have improved.  She denies any wheezing, difficulty breathing.  She has had improvement in cough.  Denies any throat irritation. Patient is afebrile, non-toxic appearing, sitting comfortably on examination table. Vital signs reviewed and stable slightly hypertensive.  On lung exam, no evidence of wheezing.  Lungs clear to auscultation bilaterally.  No  evidence of respiratory distress.Marland Kitchen. Posterior oropharynx is clear.  Airway is patent, phonation is normal.  Chest x-ray ordered at triage.  Will give 1 nebulizer treatment for bronchospasm relief.  Chest x-ray reviewed.  There is some mild atelectasis but otherwise no other acute abnormalities.  Discussed results with patient.  Patient reports improvement in symptoms.  She has been able to tolerate p.o. in the department without any difficulty.  She has not had any difficulty breathing, sensation of throat closing, throat irritation since being in the ED.  She still has a minor cough but states that has significantly improved since initial ED arrival.  Vital signs are stable.  O2 sats remained greater than 95% on room air without difficulty.  Patient stable for discharge at this time.  We will plan to send patient home with albuterol inhaler for symptomatic bronchospasm relief.  Patient instructed follow-up with her primary care doctor in the next 24-48 hours for further evaluation. Patient had ample opportunity for questions and discussion. All patient's questions were answered with full understanding. Strict return precautions discussed. Patient expresses  understanding and agreement to plan.  This  Final Clinical Impressions(s) / ED Diagnoses   Final diagnoses:  Bronchospasm  Exposure to chemical inhalation    ED Discharge Orders        Ordered    albuterol (PROVENTIL HFA;VENTOLIN HFA) 108 (90 Base) MCG/ACT inhaler  Every 6 hours PRN     03/24/17 2025       Maxwell CaulLayden, Kymir Coles A, PA-C 03/25/17 [id number]    Margarita Grizzleay, Danielle, MD 03/25/17 2235

## 2017-05-25 ENCOUNTER — Other Ambulatory Visit: Payer: Self-pay | Admitting: *Deleted

## 2017-05-25 DIAGNOSIS — I10 Essential (primary) hypertension: Secondary | ICD-10-CM

## 2017-05-25 MED ORDER — METOPROLOL TARTRATE 50 MG PO TABS: ORAL_TABLET | ORAL | 0 refills | Status: DC

## 2017-05-25 NOTE — Telephone Encounter (Signed)
Patient needs a follow up appointment. Approved 1 month of metoprolol 25 mg BID.

## 2017-06-26 ENCOUNTER — Other Ambulatory Visit: Payer: Self-pay | Admitting: *Deleted

## 2017-06-26 DIAGNOSIS — I10 Essential (primary) hypertension: Secondary | ICD-10-CM

## 2017-06-27 ENCOUNTER — Ambulatory Visit: Payer: Self-pay

## 2017-06-27 MED ORDER — METOPROLOL TARTRATE 50 MG PO TABS: ORAL_TABLET | ORAL | 0 refills | Status: DC

## 2017-07-03 ENCOUNTER — Encounter: Payer: Self-pay | Admitting: Internal Medicine

## 2017-07-03 ENCOUNTER — Ambulatory Visit (INDEPENDENT_AMBULATORY_CARE_PROVIDER_SITE_OTHER): Payer: Self-pay | Admitting: Internal Medicine

## 2017-07-03 ENCOUNTER — Other Ambulatory Visit: Payer: Self-pay

## 2017-07-03 VITALS — BP 167/83 | HR 53 | Temp 98.3°F | Ht 62.0 in | Wt 166.0 lb

## 2017-07-03 DIAGNOSIS — Z1239 Encounter for other screening for malignant neoplasm of breast: Secondary | ICD-10-CM

## 2017-07-03 DIAGNOSIS — I10 Essential (primary) hypertension: Secondary | ICD-10-CM

## 2017-07-03 DIAGNOSIS — K529 Noninfective gastroenteritis and colitis, unspecified: Secondary | ICD-10-CM

## 2017-07-03 DIAGNOSIS — Z803 Family history of malignant neoplasm of breast: Secondary | ICD-10-CM

## 2017-07-03 DIAGNOSIS — E785 Hyperlipidemia, unspecified: Secondary | ICD-10-CM

## 2017-07-03 DIAGNOSIS — Z1211 Encounter for screening for malignant neoplasm of colon: Secondary | ICD-10-CM | POA: Insufficient documentation

## 2017-07-03 DIAGNOSIS — E7800 Pure hypercholesterolemia, unspecified: Secondary | ICD-10-CM

## 2017-07-03 DIAGNOSIS — Z8679 Personal history of other diseases of the circulatory system: Secondary | ICD-10-CM

## 2017-07-03 DIAGNOSIS — Z79899 Other long term (current) drug therapy: Secondary | ICD-10-CM

## 2017-07-03 DIAGNOSIS — Z7982 Long term (current) use of aspirin: Secondary | ICD-10-CM

## 2017-07-03 DIAGNOSIS — R600 Localized edema: Secondary | ICD-10-CM

## 2017-07-03 MED ORDER — LISINOPRIL 20 MG PO TABS: 20.0000 mg | ORAL_TABLET | Freq: Every day | ORAL | 0 refills | Status: DC

## 2017-07-03 MED ORDER — ATORVASTATIN CALCIUM 20 MG PO TABS: 20.0000 mg | ORAL_TABLET | Freq: Every day | ORAL | 1 refills | Status: DC

## 2017-07-03 MED ORDER — METOPROLOL TARTRATE 50 MG PO TABS: ORAL_TABLET | ORAL | 1 refills | Status: DC

## 2017-07-03 NOTE — Assessment & Plan Note (Signed)
Assessment: Euvolemic on exam today. Patient and chart note history of "heart failure" but I'm unable to find any documentation confirming this. Most recent ECHO in 2014 was without significant systolic or diastolic dysfunction. Takes Lasix 20 mg prn swelling, usually only requiring this twice monthly.   Plan: Suspect her peripheral edema is most likely related to uncontrolled HTN. Working towards better BP control today. Encouraged pt to use lasix sparingly.

## 2017-07-03 NOTE — Assessment & Plan Note (Signed)
Assessment: Patient overdue for screening mammogram. She has family history of breast cancer in mother at age 60. No changes in breasts.   Plan: Discussed importance of screening mammogram, especially in setting of significant family history. Agreeable to screening mammo which was ordered.

## 2017-07-03 NOTE — Patient Instructions (Addendum)
It was great meeting you today. I'm glad you're doing well.   Today we talked about several topics including:  1)High Blood Pressure: your blood pressure was quite high today. I know you werent able to take Lisinopril as you usually do, but I feel that you need much better control of your blood pressure moving forward. Please: -Continue taking Metoprolol  daily -START taking Lisinopril  daily   2) High Cholesterol: We discussed high cholesterol today. It's important we start you on a medication called a "statin". Please: -START taking Atorvastatin  daily  3) We also discussed several important health screening studies. Please -Pick up the stool card on your way out. This is an easy way to screen for colon cancer. I would still like records from  if possible. -Schedule a mammogram. They will call you with an appointment.    I would like you to return to clinic in 1 month to check your blood pressure, see how you are doing with your new medications and also check your kidney function!

## 2017-07-03 NOTE — Assessment & Plan Note (Addendum)
Assessment: BP uncontrolled today at 167/83 with pulse of 53. States she was only able to take her Metoprolol this morning and has been out of lisinopril for about a week. Denies any headache, changes in vision, weakness, shortness of breath or near-syncope.   Plan: Continue Metoprolol 25 mg daily, sent 90 day supply of this Rx. Will increase Lisinopril to 20 mg daily, patient instructed to increase gradually and call if any issues with this medication.  -RTC 1 month to f/u BP and get BMET. Stressed importance of follow-up.

## 2017-07-03 NOTE — Progress Notes (Deleted)
   CC: follow-up of   HPI:  Ms.Preciosa A Neuzil is a 62 y.o.   For details regarding today's visit and the status of their chronic medical issues, please refer to the assessment and plan.  Past Medical History:  Diagnosis Date  . CHF (congestive heart failure) (HCC)   . Chronic diarrhea   . Hypertension    Review of Systems:   General: Denies fevers, chills, weight loss, fatigue HEENT: Denies acute changes in vision, sore throat, dysphagia Cardiac: Denies CP, SOB, palpitations Pulmonary: Denies cough, wheezes, PND Abd: Denies abdominal pain, changes in bowels Extremities: Denies weakness or swelling  Physical Exam: General: Alert, in no acute distress. Pleasant and conversant HEENT: No icterus, injection or ptosis. No hoarseness or dysarthria  Cardiac: RRR, no MGR appreciated Pulmonary: CTA BL with normal WOB on RA. Able to speak in complete sentences Abd: Soft, non-tender. +bs Extremities: Warm, perfused. No significant pedal edema.    Vitals:   07/03/17 1603  BP: (!) 167/83  Pulse: (!) 53  Temp: 98.3 F (36.8 C)  TempSrc: Oral  SpO2: 99%  Weight: 166 lb (75.3 kg)  Height:  (1.575 m)   Body mass index is 30.36 kg/m.  Assessment & Plan:   See Encounters Tab for problem based charting.  Patient {GC/GE:[id number]::"discussed with","seen with"} Dr. {NAMES:[id number]::"Butcher","Granfortuna","E. Hoffman","Klima","Mullen","Narendra","Raines","Vincent"}

## 2017-07-03 NOTE — Progress Notes (Signed)
   CC: follow-up of HTN, med refill  HPI:  [name] is a 62 y.o. F with HTN, HLD and reported history of CHF who presents today for medication refill. She has no acute complaints.   For details regarding today's visit and the status of their chronic medical issues, please refer to the assessment and plan.  Past Medical History:  Diagnosis Date  . CHF (congestive heart failure) (HCC)   . Chronic diarrhea   . Hypertension    Review of Systems:   General: Denies fevers, chills, weight loss HEENT: Denies acute changes in vision, cough Cardiac: Denies CP, SOB, PND, orthopnea Abd: Admits to chronic diarrhea. Denies abdominal pain, hematochezia, melena, changes in bowels Extremities: Denies numbness or weakness  Physical Exam: General: Alert, in no acute distress. Pleasant and conversant HEENT: No icterus, injection or ptosis. No hoarseness or dysarthria  Cardiac: RRR (rate ~60), no MGR appreciated Pulmonary: CTA BL with normal WOB on RA. Able to speak in complete sentences Abd: Soft, non-tender. +bs Extremities: Warm, perfused. No significant pedal edema.   Vitals:   07/03/17 1603  BP: (!) 167/83  Pulse: (!) 53  Temp: 98.3 F (36.8 C)  TempSrc: Oral  SpO2: 99%  Weight: 166 lb (75.3 kg)  Height:  (1.575 m)   Body mass index is 30.36 kg/m.  Assessment & Plan:   See Encounters Tab for problem based charting.  Patient discussed with Dr. Cleda Daub

## 2017-07-03 NOTE — Assessment & Plan Note (Signed)
Assessment: Chart review shows lipid panel was obtained in 2017. Results from this indicate a 10-year ASCVD risk of >16% and she would benefit from moderate-intensity statin. When discussing starting a statin, she was hesitant due to risk of muscle cramping, but ultimately agreeable when discussing risk vs benefit.   Plan: Start Atorvastatin  daily, continue ASA  daily. Patient instructed to contact clinic with any issues. She will likely need continued reassurance on this.

## 2017-07-03 NOTE — Assessment & Plan Note (Signed)
Assessment: Patient states she's had a colonoscopy in the past, but can't remember when. Seems to think it was 3-4 years ago at [facility]. Chart review shows this statement has been repeated for several years and her last reported colo was in 2009. She denies any changes in weight or bowels, or family history of CRC.  Plan: Immunochemical FOBT ordered for screen as most likely ~10 years since last screen. Asked patient to sign record release from .

## 2017-07-04 NOTE — Progress Notes (Signed)
Internal Medicine Clinic Attending  Case discussed with Dr. Molt at the time of the visit.  We reviewed the resident's history and exam and pertinent patient test results.  I agree with the assessment, diagnosis, and plan of care documented in the resident's note. 

## 2017-07-09 ENCOUNTER — Other Ambulatory Visit: Payer: Self-pay

## 2017-07-09 DIAGNOSIS — Z1211 Encounter for screening for malignant neoplasm of colon: Secondary | ICD-10-CM

## 2017-07-11 LAB — FECAL OCCULT BLOOD, IMMUNOCHEMICAL: FECAL OCCULT BLD: NEGATIVE

## 2017-07-12 ENCOUNTER — Telehealth: Payer: Self-pay | Admitting: Internal Medicine

## 2017-07-12 ENCOUNTER — Telehealth: Payer: Self-pay | Admitting: *Deleted

## 2017-07-12 NOTE — Telephone Encounter (Signed)
Returned pt's call - no answer; left message to call us back. 

## 2017-07-12 NOTE — Telephone Encounter (Signed)
Pt called / informed stool specimen negative for blood per Dr Cyndie Chime.

## 2017-07-12 NOTE — Telephone Encounter (Signed)
Patient calling back.   °

## 2017-07-12 NOTE — Telephone Encounter (Signed)
Called pt - no answer; left message to call back for lab result.

## 2017-07-12 NOTE — Telephone Encounter (Signed)
Patient is wanting [name] to callback

## 2017-07-12 NOTE — Telephone Encounter (Signed)
-----   Message from Levert Feinstein, MD sent at 07/11/2017  1:30 PM EDT ----- Dr Lana Fish patient: Call pt: stool negative for blood tested on 5/6

## 2017-08-20 ENCOUNTER — Ambulatory Visit
Admission: RE | Admit: 2017-08-20 | Discharge: 2017-08-20 | Disposition: A | Discharge status: Home / Self Care | Payer: BLUE CROSS/BLUE SHIELD | Source: Ambulatory Visit | Attending: Internal Medicine | Admitting: Internal Medicine

## 2017-08-20 DIAGNOSIS — Z1239 Encounter for other screening for malignant neoplasm of breast: Secondary | ICD-10-CM

## 2017-09-18 ENCOUNTER — Other Ambulatory Visit: Payer: Self-pay | Admitting: Internal Medicine

## 2017-09-18 DIAGNOSIS — I10 Essential (primary) hypertension: Secondary | ICD-10-CM

## 2017-09-19 NOTE — Telephone Encounter (Signed)
Approved 1 month refill of lisinopril. Patient needs follow up asap. [facility] Thank you.

## 2017-10-07 ENCOUNTER — Other Ambulatory Visit: Payer: Self-pay

## 2017-10-07 ENCOUNTER — Emergency Department (HOSPITAL_COMMUNITY)
Admission: EM | Admit: 2017-10-07 | Discharge: 2017-10-07 | Disposition: A | Discharge status: Home / Self Care | Payer: Self-pay | Attending: Emergency Medicine | Admitting: Emergency Medicine

## 2017-10-07 ENCOUNTER — Encounter (HOSPITAL_COMMUNITY): Payer: Self-pay

## 2017-10-07 ENCOUNTER — Emergency Department (HOSPITAL_COMMUNITY): Payer: Self-pay

## 2017-10-07 DIAGNOSIS — R6884 Jaw pain: Secondary | ICD-10-CM | POA: Insufficient documentation

## 2017-10-07 DIAGNOSIS — R51 Headache: Secondary | ICD-10-CM | POA: Insufficient documentation

## 2017-10-07 LAB — CBC
HCT: 42.4 % (ref 36.0–46.0)
Hemoglobin: 13.5 g/dL (ref 12.0–15.0)
MCH: 29 pg (ref 26.0–34.0)
MCHC: 31.8 g/dL (ref 30.0–36.0)
MCV: 91.2 fL (ref 78.0–100.0)
Platelets: 253 10*3/uL (ref 150–400)
RBC: 4.65 MIL/uL (ref 3.87–5.11)
RDW: 12.2 % (ref 11.5–15.5)
WBC: 8.5 10*3/uL (ref 4.0–10.5)

## 2017-10-07 LAB — BASIC METABOLIC PANEL
Anion gap: 7 (ref 5–15)
BUN: 11 mg/dL (ref 6–20)
CHLORIDE: 109 mmol/L (ref 98–111)
CO2: 25 mmol/L (ref 22–32)
CREATININE: 0.82 mg/dL (ref 0.44–1.00)
Calcium: 9.1 mg/dL (ref 8.9–10.3)
GFR calc Af Amer: >60 mL/min (ref >60)
GFR calc non Af Amer: >60 mL/min (ref >60)
Glucose, Bld: 95 mg/dL (ref 70–99)
Potassium: 4.2 mmol/L (ref 3.5–5.1)
SODIUM: 141 mmol/L (ref 135–145)

## 2017-10-07 LAB — I-STAT TROPONIN, ED: TROPONIN I, POC: 0 ng/mL (ref 0.00–0.08)

## 2017-10-07 NOTE — ED Triage Notes (Signed)
Pt states that she has been having right sided headache with radiation into her right jaw and right arm. States she has been out of her BP medication X1 week. Denies CP. Denies SOB.

## 2017-10-07 NOTE — ED Notes (Addendum)
Pt was in XR when she got a call related to family and states that she needed to leave. Charge made aware.

## 2017-10-08 ENCOUNTER — Ambulatory Visit: Payer: BLUE CROSS/BLUE SHIELD

## 2017-10-08 NOTE — ED Notes (Signed)
Follow up call made  No answer  10/08/17  0854  s Dajanay Northrup rn

## 2017-10-10 ENCOUNTER — Ambulatory Visit: Payer: Self-pay

## 2017-11-07 ENCOUNTER — Other Ambulatory Visit: Payer: Self-pay | Admitting: Internal Medicine

## 2017-11-07 DIAGNOSIS — I10 Essential (primary) hypertension: Secondary | ICD-10-CM

## 2017-11-07 NOTE — Telephone Encounter (Signed)
Pt needs f/u appt per Dr Jule Ser note 7/16. I called pt - no answer; unable to leave message, mailbox full.

## 2017-12-18 ENCOUNTER — Telehealth: Payer: Self-pay | Admitting: Internal Medicine

## 2017-12-18 NOTE — Telephone Encounter (Signed)
-----   Message from Reymundo Poll, MD sent at 12/17/2017 10:30 AM EDT ----- Please schedule this patient for blood pressure follow up with me at her convenience.   Thanks,  Eber Jones

## 2017-12-18 NOTE — Telephone Encounter (Signed)
Left message 147p 12/18/2017

## 2017-12-20 ENCOUNTER — Other Ambulatory Visit: Payer: Self-pay | Admitting: Internal Medicine

## 2017-12-20 DIAGNOSIS — I10 Essential (primary) hypertension: Secondary | ICD-10-CM

## 2017-12-24 NOTE — Telephone Encounter (Signed)
Pt appt 01/21/2018 @ 3:45 with Dr Antony Contras.

## 2018-01-03 ENCOUNTER — Other Ambulatory Visit: Payer: Self-pay | Admitting: *Deleted

## 2018-01-03 DIAGNOSIS — I10 Essential (primary) hypertension: Secondary | ICD-10-CM

## 2018-01-03 MED ORDER — LISINOPRIL 20 MG PO TABS: 20.0000 mg | ORAL_TABLET | Freq: Every day | ORAL | 0 refills | Status: DC

## 2018-01-03 NOTE — Telephone Encounter (Signed)
Next appt scheduled 11/18 with PCP. 

## 2018-01-11 ENCOUNTER — Other Ambulatory Visit: Payer: Self-pay | Admitting: *Deleted

## 2018-01-11 MED ORDER — ATORVASTATIN CALCIUM 20 MG PO TABS: 20.0000 mg | ORAL_TABLET | Freq: Every day | ORAL | 1 refills | Status: DC

## 2018-01-11 NOTE — Telephone Encounter (Signed)
Next appt scheduled 11/18 with PCP. 

## 2018-01-21 ENCOUNTER — Encounter: Payer: Self-pay | Admitting: Internal Medicine

## 2018-02-13 ENCOUNTER — Telehealth: Payer: Self-pay | Admitting: *Deleted

## 2018-02-13 ENCOUNTER — Other Ambulatory Visit: Payer: Self-pay | Admitting: Internal Medicine

## 2018-02-13 DIAGNOSIS — I10 Essential (primary) hypertension: Secondary | ICD-10-CM

## 2018-02-13 NOTE — Telephone Encounter (Signed)
Called pt at ph# provided in chart

## 2018-02-13 NOTE — Telephone Encounter (Signed)
Called pt about her doctor's response to med refill - no answer; left message to call the office.

## 2018-02-13 NOTE — Telephone Encounter (Signed)
Called pt to see if she would schedule an appt w/Dr Guilloud. Stated she's working 2 jobs right now so it's hard to schedule one at this time. But she will call back ("I promised") to schedule an appt.

## 2018-02-13 NOTE — Telephone Encounter (Signed)
Refill for metoprolol declined. 1 month refill already approved in July with the stipulation that she needed to be seen.

## 2018-02-14 NOTE — Telephone Encounter (Signed)
Mailbox is full - unable to leave a message. 

## 2018-02-15 NOTE — Telephone Encounter (Signed)
Letter mailed to patient  RU:[id number]:needs appt for additional medication refills.[name], Sarah Mcnerney Cassady12/13/201912:15 PM

## 2018-02-19 ENCOUNTER — Other Ambulatory Visit: Payer: Self-pay | Admitting: Internal Medicine

## 2018-02-19 DIAGNOSIS — I10 Essential (primary) hypertension: Secondary | ICD-10-CM

## 2018-02-21 ENCOUNTER — Other Ambulatory Visit: Payer: Self-pay | Admitting: Internal Medicine

## 2018-02-21 DIAGNOSIS — I10 Essential (primary) hypertension: Secondary | ICD-10-CM

## 2018-03-22 ENCOUNTER — Encounter: Payer: Self-pay | Admitting: Internal Medicine

## 2018-03-22 ENCOUNTER — Ambulatory Visit (INDEPENDENT_AMBULATORY_CARE_PROVIDER_SITE_OTHER): Payer: Self-pay | Admitting: Internal Medicine

## 2018-03-22 ENCOUNTER — Other Ambulatory Visit: Payer: Self-pay

## 2018-03-22 ENCOUNTER — Ambulatory Visit (HOSPITAL_COMMUNITY)
Admission: RE | Admit: 2018-03-22 | Discharge: 2018-03-22 | Disposition: A | Discharge status: Home / Self Care | Payer: Self-pay | Source: Ambulatory Visit | Attending: Internal Medicine | Admitting: Internal Medicine

## 2018-03-22 VITALS — BP 174/88 | HR 72 | Temp 97.5°F | Ht 62.0 in | Wt 168.0 lb

## 2018-03-22 DIAGNOSIS — R079 Chest pain, unspecified: Secondary | ICD-10-CM | POA: Insufficient documentation

## 2018-03-22 DIAGNOSIS — I5020 Unspecified systolic (congestive) heart failure: Secondary | ICD-10-CM

## 2018-03-22 DIAGNOSIS — R0789 Other chest pain: Secondary | ICD-10-CM

## 2018-03-22 DIAGNOSIS — Z79899 Other long term (current) drug therapy: Secondary | ICD-10-CM

## 2018-03-22 DIAGNOSIS — Z9114 Patient's other noncompliance with medication regimen: Secondary | ICD-10-CM

## 2018-03-22 DIAGNOSIS — I10 Essential (primary) hypertension: Secondary | ICD-10-CM

## 2018-03-22 DIAGNOSIS — I110 Hypertensive heart disease with heart failure: Secondary | ICD-10-CM

## 2018-03-22 LAB — BASIC METABOLIC PANEL
Anion gap: 7 (ref 5–15)
BUN: 11 mg/dL (ref 6–20)
CALCIUM: 8.9 mg/dL (ref 8.9–10.3)
CO2: 26 mmol/L (ref 22–32)
CREATININE: 0.86 mg/dL (ref 0.44–1.00)
Chloride: 110 mmol/L (ref 98–111)
GFR calc Af Amer: >60 mL/min (ref >60)
GFR calc non Af Amer: >60 mL/min (ref >60)
GLUCOSE: 102 mg/dL — AB (ref 70–99)
Potassium: 3.8 mmol/L (ref 3.5–5.1)
SODIUM: 143 mmol/L (ref 135–145)

## 2018-03-22 MED ORDER — LISINOPRIL 20 MG PO TABS: 20.0000 mg | ORAL_TABLET | Freq: Every day | ORAL | 0 refills | Status: DC

## 2018-03-22 MED ORDER — METOPROLOL TARTRATE 25 MG PO TABS: 25.0000 mg | ORAL_TABLET | Freq: Two times a day (BID) | ORAL | 0 refills | Status: DC

## 2018-03-22 NOTE — Progress Notes (Signed)
   CC: hypertension  HPI:  [name] is a 63 y.o. with a PMH of sCHF, HTN presenting to clinic for HTN follow up.  Patient previously on metoprolol 25mg  BID and lisinopril 20mg  daily, however states she has been w/o medicine for about 3 months. Until about a week ago, she was asymptomatic. A couple of days ago she had new onset sharp headaches at the top of her head not associated with nausea, vomiting, abd pain, vision or hearing changes, chest pain, shortness of breath, focal weakness or numbness. HA is relieved with aspirin.   She also had an episode of sharp chest pain located on her left chest. Pain occurred while sitting down; she has had similar pain a few times before. Pain yesterday lasted about 10 minutes and resolved shortly after taking aspirin. Pain was not associated with shortness of breath, nausea, vomiting, diaphoresis; there was no radiation of pain. 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   Please see problem based Assessment and Plan for status of patients chronic conditions.  Past Medical History:  Diagnosis Date  . CHF (congestive heart failure) (HCC)   . Chronic diarrhea   . Hypertension     Review of Systems:   Per HPI  Physical Exam:  Vitals:   03/22/18 1616  BP: (!) 174/88  Pulse: 72  Temp: (!) 97.5 F (36.4 C)  TempSrc: Oral  SpO2: 98%  Weight: 168 lb (76.2 kg)  Height: 5\' 2"  (1.575 m)   GENERAL- alert, co-operative, appears as stated age, not in any distress. CARDIAC- RRR, no murmurs, rubs or gallops. RESP- Moving equal volumes of air, and clear to auscultation bilaterally, no wheezes or crackles. ABDOMEN- Soft, nontender, bowel sounds present. NEURO- CN 2-12 grossly intact. EXTREMITIES- pulse 2+ PT, symmetric, no pedal edema. SKIN- Warm, dry, no rash or lesion  Assessment & Plan:   See Encounters Tab for problem based charting.   Patient discussed with Dr. Valla Leaver, MD Internal  Medicine PGY-3

## 2018-03-22 NOTE — Assessment & Plan Note (Signed)
Patient with uncontrolled HTN with new headaches.   Plan: --f/u Bmet - wnl other than hyperglycemia --restart metoprolol 25mg  BID and lisinopril 20mg  daily --f/u in 2 weeks for BP check and Bmet --discussed alarm symptoms associated with HTN with advise to RTC or present to ED

## 2018-03-22 NOTE — Assessment & Plan Note (Addendum)
Description of chest pain suggests noncardiac etiology, however with uncontrolled HTN and h/o sCHF off of treatment.  EKG obtained and reveals sinus rhythm, no q waves, mild flattening of T waves compared to most recent EKG.  No concerning findings on exam.  Plan: --advised of alarm symptoms of chest pain and to report to ED if these occur --restart metoprolol/lisinopril for HTN and CHF

## 2018-03-22 NOTE — Patient Instructions (Signed)
I refilled your metoprolol 25mg  twice a day, and lisinopril 20mg  once a day.  I will check your labs today to evaluate your kidney function since your BP is so high.  Please come back in 2 weeks so we can check how your blood pressure has responded to the medicines, to check on your headaches, and to repeat blood work as these medicines can also affect your kidneys.

## 2018-03-26 NOTE — Progress Notes (Signed)
Internal Medicine Clinic Attending  Case discussed with Dr. Svalina  at the time of the visit.  We reviewed the resident's history and exam and pertinent patient test results.  I agree with the assessment, diagnosis, and plan of care documented in the resident's note.  

## 2018-05-06 ENCOUNTER — Encounter: Payer: Self-pay | Admitting: Internal Medicine

## 2018-05-06 ENCOUNTER — Other Ambulatory Visit: Payer: Self-pay

## 2018-05-06 ENCOUNTER — Ambulatory Visit (INDEPENDENT_AMBULATORY_CARE_PROVIDER_SITE_OTHER): Payer: Self-pay | Admitting: Internal Medicine

## 2018-05-06 DIAGNOSIS — Z6830 Body mass index (BMI) 30.0-30.9, adult: Secondary | ICD-10-CM

## 2018-05-06 DIAGNOSIS — Z79899 Other long term (current) drug therapy: Secondary | ICD-10-CM

## 2018-05-06 DIAGNOSIS — E669 Obesity, unspecified: Secondary | ICD-10-CM | POA: Insufficient documentation

## 2018-05-06 DIAGNOSIS — Z23 Encounter for immunization: Secondary | ICD-10-CM

## 2018-05-06 DIAGNOSIS — I10 Essential (primary) hypertension: Secondary | ICD-10-CM

## 2018-05-06 MED ORDER — METOPROLOL TARTRATE 25 MG PO TABS: 25.0000 mg | ORAL_TABLET | Freq: Two times a day (BID) | ORAL | 0 refills | Status: DC

## 2018-05-06 MED ORDER — LISINOPRIL 20 MG PO TABS: 20.0000 mg | ORAL_TABLET | Freq: Every day | ORAL | 0 refills | Status: DC

## 2018-05-06 NOTE — Progress Notes (Signed)
   CC: HTN  HPI:  [name] is a 63 y.o. female with past medical history outlined below here for HTN follow up. For the details of today's visit, please refer to the assessment and plan.  Past Medical History:  Diagnosis Date  . CHF (congestive heart failure) (HCC)   . Chronic diarrhea   . Hypertension     Review of Systems  Respiratory: Negative for shortness of breath.   Cardiovascular: Negative for chest pain.    Physical Exam:  Vitals:   05/06/18 1604  BP: (!) 149/77  Pulse: 63  Temp: 98 F (36.7 C)  TempSrc: Oral  SpO2: 97%  Weight: 169 lb (76.7 kg)  Height: 5\' 2"  (1.575 m)    Constitutional: NAD, appears comfortable Cardiovascular: RRR, no m/r/g Pulmonary/Chest: CTAB, no wheezes, rales, or rhonchi.  Extremities: Warm and well perfused. No edema.  Psychiatric: Normal mood and affect  Assessment & Plan:   See Encounters Tab for problem based charting.  Patient discussed with Dr. Heide Spark

## 2018-05-06 NOTE — Patient Instructions (Signed)
Ms. Zingsheim,  It was a pleasure to see you. Please continue to take your blood pressure medications as previously prescribed. Continue to check you blood pressure at home, and bring your cuff with you to clinic next time to be checked.   I have referred you to meet with our dietitian. You will be called to scheduled this appointment.   Follow up with [place] again in 2 weeks for your blood pressure. If you have any questions or concerns, call our clinic at [phone] or after hours call [phone] and ask for the internal medicine resident on call. Thank you!  Dr. Antony Contras

## 2018-05-08 ENCOUNTER — Encounter: Payer: Self-pay | Admitting: Internal Medicine

## 2018-05-08 NOTE — Assessment & Plan Note (Signed)
Patient is obese, BMI 30.9, and interested in losing weight. She would like to meet with our clinic dietitian.  -- Referral placed

## 2018-05-08 NOTE — Assessment & Plan Note (Signed)
Patient is here for blood pressure follow up. Unfortunately she ran out of her medications 2 days ago. BP today is 149/77. She has her home BP log with her that only has 5 readings on it. BP fluctuates widely from 115 systolic to 180. I suspect non compliance. Her BP machine is about 61 years old. I have refilled her lisinopril and metoprolol. She will continue her home BP log and follow up in 2 week with her BP machine to have it checked.  -- Refilled metoprolol 25 mg BID -- Refilled lisinopril 20 mg daily  -- Follow up 2 weeks

## 2018-05-09 NOTE — Progress Notes (Signed)
Internal Medicine Clinic Attending  Case discussed with Dr. Guilloud at the time of the visit.  We reviewed the resident's history and exam and pertinent patient test results.  I agree with the assessment, diagnosis, and plan of care documented in the resident's note.  

## 2018-05-27 ENCOUNTER — Encounter: Payer: Self-pay | Admitting: Internal Medicine

## 2018-05-27 ENCOUNTER — Telehealth: Payer: Self-pay | Admitting: Dietician

## 2018-05-27 ENCOUNTER — Encounter: Payer: Self-pay | Admitting: Dietician

## 2018-05-27 NOTE — Telephone Encounter (Signed)
Tried calling this patient. Their voicemail box is not set up. I was unable to leave a message  

## 2018-05-27 NOTE — Telephone Encounter (Signed)
Voicemail box has not been set up. Not able to leave a message that her appointment for nutrition counseling has been cancelled.

## 2018-05-28 ENCOUNTER — Encounter: Payer: Self-pay | Admitting: Dietician

## 2018-05-28 NOTE — Telephone Encounter (Signed)
Telephone visit due to COVID. I have been unable to reach this patient by phone after multiple attempts.  A letter will be sent and her visit has been rescheduled.  Norm Parcel, RD 05/28/2018 4:15 PM.

## 2018-05-28 NOTE — Telephone Encounter (Addendum)
Patient's daughter says her mother is at work and the best time to reach her is around 4 PM.

## 2018-05-29 ENCOUNTER — Other Ambulatory Visit: Payer: Self-pay | Admitting: Internal Medicine

## 2018-05-29 ENCOUNTER — Encounter: Payer: Self-pay | Admitting: Dietician

## 2018-05-29 ENCOUNTER — Other Ambulatory Visit: Payer: Self-pay

## 2018-05-29 ENCOUNTER — Ambulatory Visit: Payer: Self-pay | Admitting: Dietician

## 2018-05-29 DIAGNOSIS — I10 Essential (primary) hypertension: Secondary | ICD-10-CM

## 2018-05-29 NOTE — Progress Notes (Signed)
  Documentation  Appt start time: 1625 end time:  1700. Visit # 1 Telephone visit due to COVID-19. Referred by Dr. Antony Contras  Assessment:  Primary concerns today: blood pressure and weight Ms. Holveck says she feels a little chubby and would like to lower her blood pressure. She has a daughter who lives with her who is supportive of a healthy lifestyle. She does her won grocery shopping 2-3x/week and does not use salt at the table or in cooking. She  wants to know if drinking vinegar will help lower her blood pressure and if using canned foods are okay.  Ms. Shandor measures her blood pressure at home. Values she reports are:  160/85 155/84 165/82 167/102- took xtra lisinopril- repeat 1 hour later was 146/76 Preferred Learning Style: No preference indicated  Learning Readiness: Ready  Wt Readings from Last 5 Encounters:  05/06/18 169 lb (76.7 kg)  03/22/18 168 lb (76.2 kg)  10/07/17 165 lb (74.8 kg)  07/03/17 166 lb (75.3 kg)  03/24/17 165 lb (74.8 kg)   MEDICATIONS: lisinopril- advised not to take extra pill, but to try to relax and recheck her blood Domenick Bookbinder as she did  DIETARY INTAKE: Usual eating pattern includes 2-3 meals and 1 snacks per day.Likes to eat at [place]- 1-2x/week. Vegetables- 1-2/day, salads- puts nuts, cranberry in it  Dairy- yogurt- loves- sometimes eats at work every other day, cannot drink milk-uses Lactaid tablets , cheese on her breakfast sandwiches Coffee now and then, sweet tea- when out, cranberry juice seldom Fruit-bananas, grapes, canned mandarin oranges Beans, nuts- just bought nuts- cashews, likes peanut butter 1-2 x/month, baked beans- 1-2x month   Vinegar- can  Canned foods- okay  Bread- biscuit, bread   Bakes all- food. 24 hour recall:   Breakfast at work- After 9:15 am Pre made sandwiches from [employer], [employer]-  Bacon egg cheese biscuit, water, maybe a banana or grapes Nabs or chips sometimes as snack Lunch 1230-skips and eats when she gets home mostly.  ~1x/week has a party at work where she eats- Lawyer sandwich, baked potato- cheese, butter sour cream, soda- and cake )   Meal at home- 230-4 -home pimento cheese, chicken salad, Hawaiian roll x2, small bag of chips, water Sometimes no dinner or 8 pm- sweet potato, 1/2 chicken breast-baked, sometimes vegetables with chicken, water  PHYSICAL ACTIVITY: Sits at computer most of day at work- 9-3 5 days/wk, listens to radio.  Watch tv- 2-3 hours a day. Takes dog Financial controller) out- lets run in back yard  SLEEP: Wakes at 715 am, Bedtime 12-1 am- reports her Sleep is not that good.  Wakes sleeps with radio,    Nutrition education done about using vinegar, canned foods, DASH diet and effects of lifestyle changes on blood pressure.  Action Goal:walk more and eat more vegetables  Outcome goal: lower blood pressure Coordination of care: none at this time  Teaching Method Utilized: Visual, Auditory,Hands on Handouts given during visit include:avs, dash diet Barriers to learning/adherence to lifestyle change: competing values Demonstrated degree of understanding via:  Teach Back   :  Dietary intake, exercise by phone in 2 week(s). Norm Parcel, RD 05/30/2018 1:40 PM.

## 2018-05-30 NOTE — Patient Instructions (Addendum)
Hi Ms. Larance,   I included an article that talks about how much each lifestyle change can lower your blood pressure and a handout about the DASH lifestyle changes for lowering blood pressure. Marland Kitchen  Here is some of what you said you leanred and your action plan:  Walking can help lower blood pressure Relaxing- better sleeping times may help. Freeze- bananas when they get too ripe  Biscuit-  limit to twice a week  Buy - whole grains more often  Beans, nuts, fruit- add fiber,  potassium, magnesium- these are all very important for blood pressure control Could go to park - twice a week and walk   ACTION GOAL: walk 30 minute on most days of the week AND try to eat more vegetables ? 1-2 cups a day?  Talk to you in 2 weeks!  Lupita Leash [phone]

## 2018-06-12 ENCOUNTER — Ambulatory Visit: Payer: Self-pay | Admitting: Dietician

## 2018-06-12 ENCOUNTER — Other Ambulatory Visit: Payer: Self-pay

## 2018-06-12 NOTE — Progress Notes (Signed)
Telephone visit due to COVID-19 to follow up on ACTION GOAL: walk 30 minute on most days of the week AND try to eat more vegetables ? 1-2 cups a day? Discussed 2 weeks ago.  Tried calling this patient. Their voicemail box is not set up. I was unable to leave a message Norm Parcel, RD 06/12/2018 4:12 PM.

## 2018-06-18 ENCOUNTER — Ambulatory Visit (INDEPENDENT_AMBULATORY_CARE_PROVIDER_SITE_OTHER): Payer: Self-pay | Admitting: Internal Medicine

## 2018-06-18 ENCOUNTER — Other Ambulatory Visit: Payer: Self-pay

## 2018-06-18 DIAGNOSIS — K047 Periapical abscess without sinus: Secondary | ICD-10-CM

## 2018-06-18 MED ORDER — AMOXICILLIN-POT CLAVULANATE 875-125 MG PO TABS: 1.0000 | ORAL_TABLET | Freq: Two times a day (BID) | ORAL | 0 refills | Status: AC

## 2018-06-18 NOTE — Progress Notes (Signed)
   CC: Tooth pain  This is a telephone encounter between [name] and [name] on 06/18/2018 for tooth pain. The visit was conducted with the patient located at home and [facility] at [facility]. The patient's identity was confirmed using their DOB and current address. The patient has consented to being evaluated through a telephone encounter and understands the associated risks (an examination cannot be done and the patient may need to come in for an appointment) / benefits (allows the patient to remain at home, decreasing exposure to coronavirus). I personally spent 6 minutes on medical discussion.   HPI:  [name] is a 63 y.o. with PMH as below.   Please see A&P for assessment of the patient's acute and chronic medical conditions.   Past Medical History:  Diagnosis Date  . CHF (congestive heart failure) (HCC)   . Chronic diarrhea   . Hypertension    Review of Systems:  Performed and all others negative.  Assessment & Plan:   See Encounters Tab for problem based charting.  Patient discussed with Dr. Cyndie Chime

## 2018-06-18 NOTE — Progress Notes (Signed)
Medicine attending: Medical history, presenting problems,  and medications, reviewed with resident physician Dr Justin Helberg on the day of the patient telephone consultation and I concur with his evaluation and management plan. 

## 2018-06-18 NOTE — Assessment & Plan Note (Signed)
HPI: Patient: with complaints of tooth pain since last week. Approximately two days ago she noticed swelling on the left side of her jaw. It does not cross into her neck or below her chin. She states that she has had pain with chewing, and some pain with talking. She is tried saltwater and or the gel without relief. She has had tooth infections in the past and states that this feels very similar to that. She denies fevers, chills, nausea/vomiting, difficulty breathing, odynophagia.   A/P: - Will start Augmentin 875mg  BID for 7 days  - Advised to call dentist to have tooth pulled  - Advised that if she developed changes in breathing, worsening pain, worsening swelling, changes in voice, or high fevers she needs to present to ED emergently for further evaluation of possible deep space infection. Voices understanding.

## 2018-06-26 ENCOUNTER — Other Ambulatory Visit: Payer: Self-pay | Admitting: Internal Medicine

## 2018-06-26 DIAGNOSIS — K047 Periapical abscess without sinus: Secondary | ICD-10-CM

## 2018-06-27 NOTE — Telephone Encounter (Signed)
Called pt - stated tooth is not swollen nor painful just she's unable to schedule an appt with a dentist.  Informed pt to call back if she dev any problems with her tooth; voiced understanding.

## 2018-07-12 ENCOUNTER — Ambulatory Visit: Payer: Self-pay | Admitting: Dietician

## 2018-07-15 ENCOUNTER — Encounter: Payer: Self-pay | Admitting: Dietician

## 2018-07-15 ENCOUNTER — Ambulatory Visit: Payer: Self-pay | Admitting: Dietician

## 2018-07-15 NOTE — Progress Notes (Signed)
Telephone visit due to COVID-19 to follow up on ACTION GOAL: walk 30 minute on most days of the week AND try to eat more vegetables ? 1-2 cups a day? Otcome goal  I connected with  [name] on 07/15/18 by telephone and verified that I am speaking with the correct person using two identifiers.I discussed the limitations of evaluation and management by telephone. The patient expressed understanding and agreed to proceed.   [name] reports improved blood pressure since starting to increase her activity and  Eat more vegetables/make healthier foods choices.  Activity-thinks she is getting 30 minutes most days of the week by parking farther away when she goes out, and exercising indoors 15 minutes two times a day, goes up andarm weights Meal planning-  Has increased vegetables and beans and nuts  Weight-  thinks it has decreased but does not have scale and has not weighed.   Blood pressure- she checks two times a day- results she provided were 146/68, 139/84, 156/83, 137/73. suggested general goal of <130/80 but encouraged her to discuss with her doctor  PLAN: encouraged possible increased benefits with continued lifestyle changes and follow up as needed.  Norm Parcel, RD 07/15/2018 1:26 PM

## 2018-08-31 ENCOUNTER — Telehealth: Payer: Self-pay | Admitting: Internal Medicine

## 2018-08-31 DIAGNOSIS — K047 Periapical abscess without sinus: Secondary | ICD-10-CM

## 2018-08-31 MED ORDER — AMOXICILLIN-POT CLAVULANATE 875-125 MG PO TABS: 1.0000 | ORAL_TABLET | Freq: Two times a day (BID) | ORAL | 0 refills | Status: AC

## 2018-08-31 NOTE — Telephone Encounter (Signed)
   Reason for call:   I received a call from [name] at 1340 PM indicating that she had tooth pain.   Pertinent Data:   Tooth pain: Her right lower posterior gum line appears infected. The tooth fractured possibly a year prior. No prior infection of this tooth but per chart review has had a prior infection of the opposite side.   Tooth area with retained root is tender, red, swollen since the 25th of this month. She stated that there was a bloody discharge yesterday, but none today.   She denied fever, chills, visual changes, palpitations, severe pain, purulent discharge.  Not relieved with ibuprofen or tylenol or Orajel.   She is not able to chew on that side. She is able to swallow without issue and chew on the opposite side.  It appears that she was given a prescription for augmentin for her prior infection. Due to COVID and insurance issues she wishes to avoid a clinic visit.    Assessment / Plan / Recommendations:   Based on her description of the tooth base and gum line as well as her prior experience we will complete a similar 7 day course of Augmentin 875mg  BID. She is advised to call a dentist or orthodontis for removal ASAP.   I have informed her that this is typically not to be a routine occurrence as this is not a definitive treatment for her issue and that the bas of the tooth will need to be removed to actually treat the problem.   As always, pt is advised that if symptoms worsen or new symptoms arise, they should go to an urgent care facility or to to ER for further evaluation.   Kathi Ludwig, MD   08/31/2018, 1:41 PM

## 2018-09-03 ENCOUNTER — Other Ambulatory Visit: Payer: Self-pay | Admitting: Internal Medicine

## 2018-09-03 DIAGNOSIS — I10 Essential (primary) hypertension: Secondary | ICD-10-CM

## 2018-09-03 MED ORDER — ATORVASTATIN CALCIUM 20 MG PO TABS: 20.0000 mg | ORAL_TABLET | Freq: Every day | ORAL | 0 refills | Status: DC

## 2018-09-03 MED ORDER — METOPROLOL TARTRATE 25 MG PO TABS: 25.0000 mg | ORAL_TABLET | Freq: Two times a day (BID) | ORAL | 2 refills | Status: DC

## 2018-10-04 ENCOUNTER — Other Ambulatory Visit: Payer: Self-pay | Admitting: Internal Medicine

## 2018-10-04 DIAGNOSIS — I10 Essential (primary) hypertension: Secondary | ICD-10-CM

## 2018-10-30 ENCOUNTER — Encounter: Payer: Self-pay | Admitting: Internal Medicine

## 2018-11-06 ENCOUNTER — Other Ambulatory Visit: Payer: Self-pay

## 2018-11-06 ENCOUNTER — Ambulatory Visit (INDEPENDENT_AMBULATORY_CARE_PROVIDER_SITE_OTHER): Payer: Self-pay | Admitting: Internal Medicine

## 2018-11-06 ENCOUNTER — Encounter: Payer: Self-pay | Admitting: Internal Medicine

## 2018-11-06 ENCOUNTER — Ambulatory Visit (HOSPITAL_COMMUNITY)
Admission: RE | Admit: 2018-11-06 | Discharge: 2018-11-06 | Disposition: A | Discharge status: Home / Self Care | Payer: Self-pay | Source: Ambulatory Visit | Attending: Internal Medicine | Admitting: Internal Medicine

## 2018-11-06 DIAGNOSIS — Z79899 Other long term (current) drug therapy: Secondary | ICD-10-CM

## 2018-11-06 DIAGNOSIS — R001 Bradycardia, unspecified: Secondary | ICD-10-CM

## 2018-11-06 DIAGNOSIS — I10 Essential (primary) hypertension: Secondary | ICD-10-CM

## 2018-11-06 DIAGNOSIS — I440 Atrioventricular block, first degree: Secondary | ICD-10-CM

## 2018-11-06 MED ORDER — LISINOPRIL-HYDROCHLOROTHIAZIDE 20-12.5 MG PO TABS: 2.0000 | ORAL_TABLET | Freq: Every day | ORAL | 2 refills | Status: DC

## 2018-11-06 NOTE — Progress Notes (Signed)
Subjective:   Patient ID: Bettina GaviaSandra A Onofre female   DOB: [birth year] 63 y.o.   MRN: [id number]  HPI: Ms.Shifra A Gerilyn PilgrimSykes is a 63 y.o. female with past medical history outlined below here for HTN. For the details of today's visit, please refer to the assessment and plan.    Past Medical History:  Diagnosis Date  . CHF (congestive heart failure) (HCC)   . Chronic diarrhea   . Hypertension    Current Outpatient Medications  Medication Sig Dispense Refill  . aspirin 81 MG tablet Take 81 mg by mouth daily.    Marland Kitchen. atorvastatin (LIPITOR) 20 MG tablet Take 1 tablet (20 mg total) by mouth daily. 90 tablet 0  . cetirizine (ZYRTEC) 10 MG tablet Take 10 mg by mouth daily as needed. For allergies    . lisinopril-hydrochlorothiazide (ZESTORETIC) 20-12.5 MG tablet Take 2 tablets by mouth daily. 60 tablet 2   No current facility-administered medications for this visit.    Family History  Problem Relation Age of Onset  . Heart attack Brother 48  . Heart disease Father 3660       cath in his 7560s.   . Diabetes Father   . Diabetes Brother   . Breast cancer Mother 5855   Social History   Socioeconomic History  . Marital status: Married    Spouse name: Not on file  . Number of children: Not on file  . Years of education: Not on file  . Highest education level: Not on file  Occupational History  . Not on file  Social Needs  . Financial resource strain: Not on file  . Food insecurity    Worry: Not on file    Inability: Not on file  . Transportation needs    Medical: Not on file    Non-medical: Not on file  Tobacco Use  . Smoking status: Never Smoker  . Smokeless tobacco: Never Used  Substance and Sexual Activity  . Alcohol use: No    Alcohol/week: 0.0 standard drinks  . Drug use: No  . Sexual activity: Yes  Lifestyle  . Physical activity    Days per week: Not on file    Minutes per session: Not on file  . Stress: Not on file  Relationships  . Social Musicianconnections    Talks on phone: Not  on file    Gets together: Not on file    Attends religious service: Not on file    Active member of club or organization: Not on file    Attends meetings of clubs or organizations: Not on file    Relationship status: Not on file  Other Topics Concern  . Not on file  Social History Narrative   [place]  2 boys and 1 girl.  Former Associate Professorretail worker.  High school graduate with some college but no degree (for education).     Review of Systems: Cardiovascular: negative for chest pain and fatigue Neurological: negative for dizziness and weakness Objective:  Physical Exam: Vitals:   11/06/18 0844  BP: (!) 145/80  Pulse: (!) 48  Temp: 98.3 F (36.8 C)  TempSrc: Oral  SpO2: 98%  Weight: 168 lb 4.8 oz (76.3 kg)  Height: 5\' 2"  (1.575 m)   Physical Exam Constitutional: NAD, appears comfortable HEENT: Atraumatic, normocephalic. Normal oropharynx  Neck: Supple, trachea midline.  Cardiovascular: Bradycardic but regular, no murmurs, rubs, or gallops.  Pulmonary/Chest: CTAB, no wheezes, rales, or rhonchi. Extremities: Warm and well perfused. No  edema.  Psychiatric: Normal mood and affect  Assessment & Plan:

## 2018-11-06 NOTE — Assessment & Plan Note (Signed)
Patient is bradycardic to the 40s and asymptomatic. She is prescribed metoprolol 25 mg BID and has been on a beta blocker now for almost 10 years. She denies dizziness and pre syncope. EKG shows sinus bradycardia with first degree AV block. She does admit to taking an extra tablet of metoprolol when her BP is over 320 systolic, but took her normal dose today. Advised patient to take her medicine as prescribed and call the office if her BP is persistently > 180 or she develops symptoms. Given her bradycardia, patient is agreeable to stopping metoprolol. Will continue to monitor.

## 2018-11-06 NOTE — Patient Instructions (Signed)
Ms. Holck,  It was a pleasure to see you today. Because your heart rate is low, please stop taking metoprolol. Stop taking your lisinopril as well.  I have started you on a new medication, a combination lisinopril-hydrochlorothiazide pill. Please take 2 tablets once a day. Continue to check your blood at home and follow up with me in 3 months. We will check blood work at your next visit.   If you have any questions or concerns, call our clinic at [phone] or after hours call [phone] and ask for the internal medicine resident on call. Thank you!  Dr. Philipp Ovens

## 2018-11-06 NOTE — Assessment & Plan Note (Addendum)
Patient is here for blood pressure follow up. She ran out of her lisinopril 2 days ago, but has been taking her metoprolol as prescribed. She reports consistently elevated blood pressures at home with systolic BP in the 470J. She is asymptomatic. Denies headaches, chest pain, and SOB. She does admit to taking extra tablets of metoprolol when her BP is high. Of note patient is bradycardic today, HR in the 40s. She denies dizziness or presyncope. Advised patient not to take more metoprolol than prescribed.  -- Stop metoprolol  -- Increase lisinopril to 40 mg; add hydrochlorothiazide. I have started a combination lisinopril-HCTZ 20-12.5 mg, 2 tablets once a day.  -- Continue home BP monitoring -- Follow up 3 months with home log -- BMP at follow up

## 2019-02-19 ENCOUNTER — Other Ambulatory Visit: Payer: Self-pay | Admitting: Internal Medicine

## 2019-02-19 NOTE — Telephone Encounter (Signed)
Approved 1 month refill of lisinopril-hctz. Patient needs in person follow up for HTN. [facility] is fine if my schedule is full. Thanks!

## 2019-02-25 ENCOUNTER — Other Ambulatory Visit: Payer: Self-pay | Admitting: *Deleted

## 2019-02-25 MED ORDER — ATORVASTATIN CALCIUM 20 MG PO TABS: 20.0000 mg | ORAL_TABLET | Freq: Every day | ORAL | 3 refills | Status: DC

## 2019-03-20 ENCOUNTER — Encounter: Payer: Self-pay | Admitting: Internal Medicine

## 2019-03-25 ENCOUNTER — Other Ambulatory Visit: Payer: Self-pay | Admitting: Internal Medicine

## 2019-04-09 ENCOUNTER — Other Ambulatory Visit: Payer: Self-pay

## 2019-04-09 ENCOUNTER — Ambulatory Visit (INDEPENDENT_AMBULATORY_CARE_PROVIDER_SITE_OTHER): Payer: Self-pay | Admitting: Internal Medicine

## 2019-04-09 ENCOUNTER — Encounter: Payer: Self-pay | Admitting: Internal Medicine

## 2019-04-09 VITALS — BP 102/59 | HR 67 | Temp 98.2°F | Ht 62.0 in | Wt 164.6 lb

## 2019-04-09 DIAGNOSIS — Z23 Encounter for immunization: Secondary | ICD-10-CM

## 2019-04-09 DIAGNOSIS — Z0000 Encounter for general adult medical examination without abnormal findings: Secondary | ICD-10-CM | POA: Insufficient documentation

## 2019-04-09 DIAGNOSIS — Z79899 Other long term (current) drug therapy: Secondary | ICD-10-CM

## 2019-04-09 DIAGNOSIS — I10 Essential (primary) hypertension: Secondary | ICD-10-CM

## 2019-04-09 DIAGNOSIS — R202 Paresthesia of skin: Secondary | ICD-10-CM

## 2019-04-09 DIAGNOSIS — R7303 Prediabetes: Secondary | ICD-10-CM

## 2019-04-09 LAB — POCT GLYCOSYLATED HEMOGLOBIN (HGB A1C): Hemoglobin A1C: 5.9 % — AB (ref 4.0–5.6)

## 2019-04-09 LAB — GLUCOSE, CAPILLARY: Glucose-Capillary: 91 mg/dL (ref 70–99)

## 2019-04-09 MED ORDER — GABAPENTIN 300 MG PO CAPS: 300.0000 mg | ORAL_CAPSULE | Freq: Three times a day (TID) | ORAL | 2 refills | Status: DC

## 2019-04-09 MED ORDER — LISINOPRIL-HYDROCHLOROTHIAZIDE 20-12.5 MG PO TABS: 1.0000 | ORAL_TABLET | Freq: Every day | ORAL | 0 refills | Status: DC

## 2019-04-09 NOTE — Patient Instructions (Addendum)
[name],  It was a pleasure to see you. I will call you with the results of your lab work today.   For your blood pressure medicine, please take one tablet of the lisinopril - hydrochlorothiazide once a day instead of 2 tablets. I have started a nerve pain medication called gabapentin.   Follow up with me again in 3 months or sooner if you have any issues. If you have any questions or concerns, call our clinic at [phone] or after hours call [phone] and ask for the internal medicine resident on call.  Thank you!  Dr. Antony Contras

## 2019-04-09 NOTE — Progress Notes (Signed)
Subjective:   Patient ID: [name] female   DOB: [birth year] 64 y.o.   MRN: [id number]  HPI: [name] is a 64 y.o. female with past medical history outlined below here for HTN follow up. For the details of today's visit, please refer to the assessment and plan.   Past Medical History:  Diagnosis Date  . CHF (congestive heart failure) ([facility])   . Chronic diarrhea   . Hypertension    Current Outpatient Medications  Medication Sig Dispense Refill  . aspirin 81 MG tablet Take 81 mg by mouth daily.    Marland Kitchen atorvastatin (LIPITOR) 20 MG tablet Take 1 tablet (20 mg total) by mouth daily. 90 tablet 3  . cetirizine (ZYRTEC) 10 MG tablet Take 10 mg by mouth daily as needed. For allergies    . gabapentin (NEURONTIN) 300 MG capsule Take 1 capsule (300 mg total) by mouth 3 (three) times daily. 90 capsule 2  . lisinopril-hydrochlorothiazide (ZESTORETIC) 20-12.5 MG tablet Take 1 tablet by mouth daily. 90 tablet 0   No current facility-administered medications for this visit.   Family History  Problem Relation Age of Onset  . Heart attack Brother 37  . Heart disease Father 2       cath in his 49s.   . Diabetes Father   . Diabetes Brother   . Breast cancer Mother 30   Social History   Socioeconomic History  . Marital status: Married    Spouse name: Not on file  . Number of children: Not on file  . Years of education: Not on file  . Highest education level: Not on file  Occupational History  . Not on file  Tobacco Use  . Smoking status: Never Smoker  . Smokeless tobacco: Never Used  Substance and Sexual Activity  . Alcohol use: No    Alcohol/week: 0.0 standard drinks  . Drug use: No  . Sexual activity: Yes  Other Topics Concern  . Not on file  Social History Narrative   Married and lives with husband in [place].  2 boys and 1 girl.  Former Careers adviser.  High school graduate with some college but no degree (for education).     Social Determinants of Health    Financial Resource Strain:   . Difficulty of Paying Living Expenses: Not on file  Food Insecurity:   . Worried About Charity fundraiser in the Last Year: Not on file  . Ran Out of Food in the Last Year: Not on file  Transportation Needs:   . Lack of Transportation (Medical): Not on file  . Lack of Transportation (Non-Medical): Not on file  Physical Activity:   . Days of Exercise per Week: Not on file  . Minutes of Exercise per Session: Not on file  Stress:   . Feeling of Stress : Not on file  Social Connections:   . Frequency of Communication with Friends and Family: Not on file  . Frequency of Social Gatherings with Friends and Family: Not on file  . Attends Religious Services: Not on file  . Active Member of Clubs or Organizations: Not on file  . Attends Archivist Meetings: Not on file  . Marital Status: Not on file    Review of Systems: Review of Systems  Cardiovascular: Negative for chest pain.  Neurological: Negative for dizziness and loss of consciousness.     Objective:  Physical Exam:  Vitals:   04/09/19 0831 04/09/19 0853  BP: (!) 97/56 Marland Kitchen)  102/59  Pulse: 72 67  Temp: 98.2 F (36.8 C)   TempSrc: Oral   SpO2: 98%   Weight: 164 lb 9.6 oz (74.7 kg)   Height: 5\' 2"  (1.575 m)     Physical Exam  Constitutional: She is oriented to person, place, and time and well-developed, well-nourished, and in no distress. No distress.  Cardiovascular: Normal rate, regular rhythm and normal heart sounds.  Pulmonary/Chest: Effort normal and breath sounds normal.  Musculoskeletal:        General: No edema.  Neurological: She is alert and oriented to person, place, and time.  Skin: She is not diaphoretic.     Assessment & Plan:   See Encounters Tab for problem based charting.

## 2019-04-10 ENCOUNTER — Encounter: Payer: Self-pay | Admitting: Internal Medicine

## 2019-04-10 DIAGNOSIS — R7303 Prediabetes: Secondary | ICD-10-CM | POA: Insufficient documentation

## 2019-04-10 LAB — BMP8+ANION GAP
Anion Gap: 14 mmol/L (ref 10.0–18.0)
BUN/Creatinine Ratio: 17 (ref 12–28)
BUN: 18 mg/dL (ref 8–27)
CO2: 23 mmol/L (ref 20–29)
Calcium: 9.4 mg/dL (ref 8.7–10.3)
Chloride: 102 mmol/L (ref 96–106)
Creatinine, Ser: 1.04 mg/dL — ABNORMAL HIGH (ref 0.57–1.00)
GFR calc Af Amer: 67 mL/min/{1.73_m2} (ref >59)
GFR calc non Af Amer: 58 mL/min/{1.73_m2} — ABNORMAL LOW (ref >59)
Glucose: 90 mg/dL (ref 65–99)
Potassium: 4.5 mmol/L (ref 3.5–5.2)
Sodium: 139 mmol/L (ref 134–144)

## 2019-04-10 LAB — VITAMIN B12: Vitamin B-12: 389 pg/mL (ref 232–1245)

## 2019-04-10 NOTE — Assessment & Plan Note (Signed)
Received flu shot today. Repeat immunochemical FOBT for colon cancer screening.

## 2019-04-10 NOTE — Assessment & Plan Note (Signed)
Patient is here for BP follow up. At her last visit, her metoprolol was d/c'd due to bradycardia. Her lisinopril was changed to lisinopril-hctz combination pill, 20-12.5 mg (2 tablets once a day). Today her BP is low, 97/56 and 102/59 on recheck. She is asymptomatic. Denies dizziness or lightheadedness. Will decrease lisinopril-hctz to 1 tablet of the 20-12.5 mg pills once a day. BMP today with mild decrease in her renal function. Will repeat at follow up.

## 2019-04-10 NOTE — Assessment & Plan Note (Signed)
Patient is complaining of paresthesias in her bilateral hands and arms. She has some numbness in her feet as well. She denies neck or back pain. No weakness. Neurological exam is intact. Work up thus far with Vit B12 and repeat Hgb A1c today are non contributory. Hgb A1c is in the pre diabetic range. Unclear etiology of her paresthesias. Could consider nerve conduction studies in the future. Will treat will gabapentin for now, 300 mg TID.

## 2019-04-11 ENCOUNTER — Other Ambulatory Visit: Payer: Self-pay

## 2019-04-11 DIAGNOSIS — Z0000 Encounter for general adult medical examination without abnormal findings: Secondary | ICD-10-CM

## 2019-04-12 LAB — FECAL OCCULT BLOOD, IMMUNOCHEMICAL: Fecal Occult Bld: NEGATIVE

## 2019-04-14 NOTE — Progress Notes (Signed)
Called patient with results of her negative FOBT, plan to repeat 1 year. Patient stated that gabapentin has been making her a little dizzy. She is only taking it at night currently, and just started the medicine 3 days ago. She has noticed improvement in her pain. But she feels dizzy for a short period of time when she wakes in the morning. Instructed patient to continue medicine QHS for now, and to call if dizziness does not improve. I am hopeful this side effect will diminish has her body adjust to the medication.

## 2019-08-06 ENCOUNTER — Encounter: Payer: Self-pay | Admitting: Internal Medicine

## 2019-08-06 ENCOUNTER — Other Ambulatory Visit: Payer: Self-pay

## 2019-08-06 ENCOUNTER — Ambulatory Visit (INDEPENDENT_AMBULATORY_CARE_PROVIDER_SITE_OTHER): Payer: BLUE CROSS/BLUE SHIELD | Admitting: Internal Medicine

## 2019-08-06 VITALS — BP 114/57 | HR 64 | Temp 98.1°F | Ht 62.0 in | Wt 168.5 lb

## 2019-08-06 DIAGNOSIS — E785 Hyperlipidemia, unspecified: Secondary | ICD-10-CM

## 2019-08-06 DIAGNOSIS — I10 Essential (primary) hypertension: Secondary | ICD-10-CM | POA: Diagnosis not present

## 2019-08-06 DIAGNOSIS — R202 Paresthesia of skin: Secondary | ICD-10-CM | POA: Diagnosis not present

## 2019-08-06 DIAGNOSIS — J309 Allergic rhinitis, unspecified: Secondary | ICD-10-CM | POA: Diagnosis not present

## 2019-08-06 MED ORDER — LISINOPRIL-HYDROCHLOROTHIAZIDE 20-12.5 MG PO TABS: 1.0000 | ORAL_TABLET | Freq: Every day | ORAL | 3 refills | Status: DC

## 2019-08-06 MED ORDER — CETIRIZINE HCL 10 MG PO TABS: 10.0000 mg | ORAL_TABLET | Freq: Every day | ORAL | 3 refills | Status: DC | PRN

## 2019-08-06 MED ORDER — GABAPENTIN 300 MG PO CAPS: 300.0000 mg | ORAL_CAPSULE | Freq: Every day | ORAL | 3 refills | Status: DC

## 2019-08-06 MED ORDER — ATORVASTATIN CALCIUM 20 MG PO TABS: 20.0000 mg | ORAL_TABLET | Freq: Every day | ORAL | 3 refills | Status: DC

## 2019-08-06 NOTE — Patient Instructions (Signed)
Ms. Marrocco,  It was a pleasure to see you today. Please continue taking all of your medicines as previously prescribed. I will give you a call with the results of your blood work tomorrow. Follow up with me again in 6 months.  If you have any questions or concerns, call our clinic at [phone] or after hours call [phone] and ask for the internal medicine resident on call. Thank you!  Dr. Antony Contras

## 2019-08-06 NOTE — Progress Notes (Signed)
Subjective:   Patient ID: [name] female   DOB: [birth year] 64 y.o.   MRN: [id number]  HPI: [name] is a 64 y.o. female with past medical history outlined below here for HTN follow up. For the details of today's visit, please refer to the assessment and plan.   Past Medical History:  Diagnosis Date  . CHF (congestive heart failure) ([place])   . Chronic diarrhea   . Hypertension    Current Outpatient Medications  Medication Sig Dispense Refill  . aspirin 81 MG tablet Take 81 mg by mouth daily.    Marland Kitchen atorvastatin (LIPITOR) 20 MG tablet Take 1 tablet (20 mg total) by mouth daily. 90 tablet 3  . cetirizine (ZYRTEC) 10 MG tablet Take 10 mg by mouth daily as needed. For allergies    . gabapentin (NEURONTIN) 300 MG capsule Take 1 capsule (300 mg total) by mouth 3 (three) times daily. 90 capsule 2  . lisinopril-hydrochlorothiazide (ZESTORETIC) 20-12.5 MG tablet Take 1 tablet by mouth daily. 90 tablet 0   No current facility-administered medications for this visit.   Family History  Problem Relation Age of Onset  . Heart attack Brother 48  . Heart disease Father 5       cath in his 25s.   . Diabetes Father   . Diabetes Brother   . Breast cancer Mother 60   Social History   Socioeconomic History  . Marital status: Married    Spouse name: Not on file  . Number of children: Not on file  . Years of education: Not on file  . Highest education level: Not on file  Occupational History  . Not on file  Tobacco Use  . Smoking status: Never Smoker  . Smokeless tobacco: Never Used  Substance and Sexual Activity  . Alcohol use: No    Alcohol/week: 0.0 standard drinks  . Drug use: No  . Sexual activity: Yes  Other Topics Concern  . Not on file  Social History Narrative   Married and lives with husband in [place].  2 boys and 1 girl.  Former Careers adviser.  High school graduate with some college but no degree (for education).     Social Determinants of Health    Financial Resource Strain:   . Difficulty of Paying Living Expenses:   Food Insecurity:   . Worried About Charity fundraiser in the Last Year:   . Arboriculturist in the Last Year:   Transportation Needs:   . Film/video editor (Medical):   Marland Kitchen Lack of Transportation (Non-Medical):   Physical Activity:   . Days of Exercise per Week:   . Minutes of Exercise per Session:   Stress:   . Feeling of Stress :   Social Connections:   . Frequency of Communication with Friends and Family:   . Frequency of Social Gatherings with Friends and Family:   . Attends Religious Services:   . Active Member of Clubs or Organizations:   . Attends Archivist Meetings:   Marland Kitchen Marital Status:     Review of Systems: Review of Systems  Respiratory: Negative for cough.   Cardiovascular: Negative for chest pain.     Objective:  Physical Exam:  Vitals:   08/06/19 0829  BP: (!) 114/57  Pulse: 64  Temp: 98.1 F (36.7 C)  TempSrc: Oral  SpO2: 100%  Weight: 168 lb 8 oz (76.4 kg)  Height: 5\' 2"  (1.575 m)    Physical Exam  Constitutional: She is oriented to person, place, and time and well-developed, well-nourished, and in no distress. No distress.  HENT:  Head: Normocephalic and atraumatic.  Eyes: Pupils are equal, round, and reactive to light. Conjunctivae are normal.  Cardiovascular: Normal rate, regular rhythm and normal heart sounds. Exam reveals no gallop and no friction rub.  No murmur heard. Pulmonary/Chest: Effort normal and breath sounds normal. She has no wheezes.  Neurological: She is alert and oriented to person, place, and time. No cranial nerve deficit.  Psychiatric: Mood and affect normal.     Assessment & Plan:   See Encounters Tab for problem based charting.

## 2019-08-06 NOTE — Assessment & Plan Note (Addendum)
Well controlled today, 114/57. Lisinopril-HCTZ was decreased at her last visit due to asymptomatic hypotension.  -- Continue lisinopril-hctz 20-12.5 mg daily, refills sent to pharmacy -- Repeat BMP today   ADDENDUM: BMP within normal limits. Called patient with results.

## 2019-08-06 NOTE — Assessment & Plan Note (Signed)
Well controlled on daily zyrtec. Refills sent to pharmacy.

## 2019-08-06 NOTE — Assessment & Plan Note (Addendum)
Currently taking atorvastatin 20 mg. Refill sent to pharmacy. Repeat lipid panel today.   ADDENDUM: Lipid panel with LDL 64, 10-year ASCVD risk is low < 4% on current statin therapy. Continue atorvastatin. Called patient with results.

## 2019-08-06 NOTE — Assessment & Plan Note (Signed)
Well controlled on low dose gabapentin. Only taking once daily instead of TID.  -- Continue gabapentin 300 mg daily

## 2019-08-07 LAB — LIPID PANEL
Chol/HDL Ratio: 2.6 ratio (ref 0.0–4.4)
Cholesterol, Total: 132 mg/dL (ref 100–199)
HDL: 50 mg/dL (ref >39)
LDL Chol Calc (NIH): 64 mg/dL (ref 0–99)
Triglycerides: 97 mg/dL (ref 0–149)
VLDL Cholesterol Cal: 18 mg/dL (ref 5–40)

## 2019-08-07 LAB — BMP8+ANION GAP
Anion Gap: 13 mmol/L (ref 10.0–18.0)
BUN/Creatinine Ratio: 19 (ref 12–28)
BUN: 16 mg/dL (ref 8–27)
CO2: 23 mmol/L (ref 20–29)
Calcium: 9 mg/dL (ref 8.7–10.3)
Chloride: 104 mmol/L (ref 96–106)
Creatinine, Ser: 0.86 mg/dL (ref 0.57–1.00)
GFR calc Af Amer: 84 mL/min/{1.73_m2} (ref >59)
GFR calc non Af Amer: 73 mL/min/{1.73_m2} (ref >59)
Glucose: 86 mg/dL (ref 65–99)
Potassium: 4.3 mmol/L (ref 3.5–5.2)
Sodium: 140 mmol/L (ref 134–144)

## 2020-07-12 ENCOUNTER — Other Ambulatory Visit: Payer: Self-pay | Admitting: Internal Medicine

## 2020-07-12 DIAGNOSIS — I10 Essential (primary) hypertension: Secondary | ICD-10-CM

## 2020-07-14 ENCOUNTER — Other Ambulatory Visit: Payer: Self-pay | Admitting: Internal Medicine

## 2020-07-14 DIAGNOSIS — I10 Essential (primary) hypertension: Secondary | ICD-10-CM

## 2020-07-19 ENCOUNTER — Telehealth: Payer: Self-pay

## 2020-07-19 NOTE — Telephone Encounter (Signed)
Zestoretic was refilled 5/9 qty# 90 tabs. Called pt to let her know - no answer; vm has not been set-up, unable to leave a message.

## 2020-07-19 NOTE — Telephone Encounter (Signed)
Patient called back. Informed her med was sent to [facility] on 07/12/20. Patient states she was out of med x 2 weeks. She will restart med today and if sx persist she will call back to schedule appt.

## 2020-07-19 NOTE — Telephone Encounter (Signed)
Pt has fluid build up on her legs needs a refill lisinopril-hydrochlorothiazide (ZESTORETIC) 20-12.5 MG tablet  [id number] - [place] ([place]), [place] - [address]

## 2020-08-11 ENCOUNTER — Ambulatory Visit (INDEPENDENT_AMBULATORY_CARE_PROVIDER_SITE_OTHER): Payer: BLUE CROSS/BLUE SHIELD | Admitting: Internal Medicine

## 2020-08-11 ENCOUNTER — Encounter: Payer: Self-pay | Admitting: Internal Medicine

## 2020-08-11 ENCOUNTER — Other Ambulatory Visit: Payer: Self-pay

## 2020-08-11 VITALS — BP 110/66 | HR 71 | Temp 98.0°F | Ht 62.0 in | Wt 175.7 lb

## 2020-08-11 DIAGNOSIS — Z1211 Encounter for screening for malignant neoplasm of colon: Secondary | ICD-10-CM

## 2020-08-11 DIAGNOSIS — I10 Essential (primary) hypertension: Secondary | ICD-10-CM

## 2020-08-11 DIAGNOSIS — R7303 Prediabetes: Secondary | ICD-10-CM | POA: Diagnosis not present

## 2020-08-11 DIAGNOSIS — R202 Paresthesia of skin: Secondary | ICD-10-CM

## 2020-08-11 DIAGNOSIS — Z1159 Encounter for screening for other viral diseases: Secondary | ICD-10-CM

## 2020-08-11 DIAGNOSIS — J309 Allergic rhinitis, unspecified: Secondary | ICD-10-CM

## 2020-08-11 DIAGNOSIS — Z862 Personal history of diseases of the blood and blood-forming organs and certain disorders involving the immune mechanism: Secondary | ICD-10-CM

## 2020-08-11 DIAGNOSIS — Z1231 Encounter for screening mammogram for malignant neoplasm of breast: Secondary | ICD-10-CM | POA: Insufficient documentation

## 2020-08-11 DIAGNOSIS — E785 Hyperlipidemia, unspecified: Secondary | ICD-10-CM

## 2020-08-11 LAB — POCT GLYCOSYLATED HEMOGLOBIN (HGB A1C): Hemoglobin A1C: 5.8 % — AB (ref 4.0–5.6)

## 2020-08-11 LAB — GLUCOSE, CAPILLARY: Glucose-Capillary: 92 mg/dL (ref 70–99)

## 2020-08-11 MED ORDER — LISINOPRIL-HYDROCHLOROTHIAZIDE 20-12.5 MG PO TABS
1.0000 | ORAL_TABLET | Freq: Every day | ORAL | 3 refills | Status: DC
Start: 2020-08-11 — End: 2021-08-24

## 2020-08-11 MED ORDER — CETIRIZINE HCL 10 MG PO TABS: 10.0000 mg | ORAL_TABLET | Freq: Every day | ORAL | 3 refills | Status: AC

## 2020-08-11 MED ORDER — ATORVASTATIN CALCIUM 20 MG PO TABS: 20.0000 mg | ORAL_TABLET | Freq: Every day | ORAL | 3 refills | Status: DC

## 2020-08-11 MED ORDER — GABAPENTIN 300 MG PO CAPS: 300.0000 mg | ORAL_CAPSULE | Freq: Every day | ORAL | 3 refills | Status: DC

## 2020-08-11 NOTE — Assessment & Plan Note (Signed)
Order for screening mammography placed.

## 2020-08-11 NOTE — Assessment & Plan Note (Signed)
Patient is complaining of bilateral ear fullness.  On exam her external ear canals are completely clear with good visualization of her bilateral tympanic membranes which appear normal.  Patient endorses seasonal sinus congestion.  Suspect her ear fullness could be intermittent fluid accumulation in her middle ear related to allergies.  Recommended daily Zyrtec and Flonase for her nasal congestion.

## 2020-08-11 NOTE — Patient Instructions (Signed)
[name],  It was a pleasure to see you. I have sent refills of your medicine to your pharmacy. I will call you with the results of your blood work. Follow up with me again in 1 year. If you have any questions or concerns, call our clinic at [phone] or after hours call [phone] and ask for the internal medicine resident on call.  Thank you!  Dr. Reece Agar

## 2020-08-11 NOTE — Assessment & Plan Note (Signed)
Follow-up hep C antibody.

## 2020-08-11 NOTE — Assessment & Plan Note (Signed)
Last lipid panel 1 year ago with LDL of 64 at goal.  Refilled atorvastatin 20 mg daily for primary prevention.  Monitor lipid panel every 2-3 years.

## 2020-08-11 NOTE — Progress Notes (Signed)
Subjective:   Patient ID: [name] female   DOB: 22-Oct-1955 65 y.o.   MRN: [id number]  HPI: [name] is a 65 y.o. female with past medical history outlined below here for HTN follow up. For the details of today's visit, please refer to the assessment and plan.  Past Medical History:  Diagnosis Date  . CHF (congestive heart failure) ([facility])   . Chronic diarrhea   . Hypertension    Current Outpatient Medications  Medication Sig Dispense Refill  . aspirin 81 MG tablet Take 81 mg by mouth daily.    Marland Kitchen atorvastatin (LIPITOR) 20 MG tablet Take 1 tablet (20 mg total) by mouth daily. 90 tablet 3  . cetirizine (ZYRTEC) 10 MG tablet Take 1 tablet (10 mg total) by mouth daily as needed. For allergies 90 tablet 3  . gabapentin (NEURONTIN) 300 MG capsule Take 1 capsule (300 mg total) by mouth daily. 90 capsule 3  . lisinopril-hydrochlorothiazide (ZESTORETIC) 20-12.5 MG tablet Take 1 tablet by mouth once daily 90 tablet 0   No current facility-administered medications for this visit.   Family History  Problem Relation Age of Onset  . Heart attack Brother 48  . Heart disease Father 45       cath in his 38s.   . Diabetes Father   . Diabetes Brother   . Breast cancer Mother 72   Social History   Socioeconomic History  . Marital status: Married    Spouse name: Not on file  . Number of children: Not on file  . Years of education: Not on file  . Highest education level: Not on file  Occupational History  . Not on file  Tobacco Use  . Smoking status: Never Smoker  . Smokeless tobacco: Never Used  Substance and Sexual Activity  . Alcohol use: No    Alcohol/week: 0.0 standard drinks  . Drug use: No  . Sexual activity: Yes  Other Topics Concern  . Not on file  Social History Narrative   Married and lives with husband in [place].  2 boys and 1 girl.  Former Associate Professor.  High school graduate with some college but no degree (for education).     Social Determinants of  Health   Financial Resource Strain: Not on file  Food Insecurity: Not on file  Transportation Needs: Not on file  Physical Activity: Not on file  Stress: Not on file  Social Connections: Not on file    Review of Systems: Review of Systems  Respiratory: Negative for shortness of breath.   Cardiovascular: Negative for chest pain.     Objective:  Physical Exam:  Vitals:   08/11/20 1006  BP: 110/66  Pulse: 71  Temp: 98 F (36.7 C)  TempSrc: Oral  SpO2: 96%  Weight: 175 lb 11.2 oz (79.7 kg)  Height: 5\' 2"  (1.575 m)    Physical Exam Constitutional:      Appearance: Normal appearance.  HENT:     Right Ear: Tympanic membrane and ear canal normal.     Left Ear: Tympanic membrane and ear canal normal.  Cardiovascular:     Rate and Rhythm: Normal rate and regular rhythm.  Pulmonary:     Effort: Pulmonary effort is normal.     Breath sounds: Normal breath sounds.  Musculoskeletal:     Right lower leg: No edema.     Left lower leg: No edema.  Skin:    General: Skin is warm and dry.  Neurological:  Mental Status: She is alert.  Psychiatric:        Mood and Affect: Mood normal.        Behavior: Behavior normal.      Assessment & Plan:   See Encounters Tab for problem based charting.

## 2020-08-11 NOTE — Assessment & Plan Note (Signed)
Patient was provided with FIT testing kit.

## 2020-08-11 NOTE — Assessment & Plan Note (Signed)
Patient has persistent stocking glove distribution neuropathy.  Hemoglobin A1c is in the prediabetic range.  Vitamin B12 level was checked at her last appointment and within normal limits.  Checking HIV antibody and TSH today for completion of work-up.  Plan to restart low-dose gabapentin 300 mg daily for symptom control.  Uptitrate as needed.

## 2020-08-11 NOTE — Assessment & Plan Note (Signed)
A1c today stable, 5.8 from 5.9 one year ago.  Still in the prediabetic range.  We discussed the option of initiating GLP-1 therapy for the dual benefit of weight loss.  Patient prefers to try diet and lifestyle interventions first.  We discussed the recommendations for 150 minutes of moderate intensity exercise weekly and caloric deficit needed to achieve weight loss.

## 2020-08-11 NOTE — Assessment & Plan Note (Signed)
Continues to be well controlled, 110/66 today.  Checking renal function labs.  Refilled lisinopril-hydrochlorothiazide 12-12.5 mg daily.

## 2020-08-12 LAB — CBC
Hematocrit: 37.7 % (ref 34.0–46.6)
Hemoglobin: 13 g/dL (ref 11.1–15.9)
MCH: 29.9 pg (ref 26.6–33.0)
MCHC: 34.5 g/dL (ref 31.5–35.7)
MCV: 87 fL (ref 79–97)
Platelets: 297 10*3/uL (ref 150–450)
RBC: 4.35 x10E6/uL (ref 3.77–5.28)
RDW: 12.7 % (ref 11.7–15.4)
WBC: 7.4 10*3/uL (ref 3.4–10.8)

## 2020-08-12 LAB — CMP14 + ANION GAP
ALT: 21 IU/L (ref 0–32)
AST: 19 IU/L (ref 0–40)
Albumin/Globulin Ratio: 1.5 (ref 1.2–2.2)
Albumin: 4 g/dL (ref 3.8–4.8)
Alkaline Phosphatase: 73 IU/L (ref 44–121)
Anion Gap: 17 mmol/L (ref 10.0–18.0)
BUN/Creatinine Ratio: 15 (ref 12–28)
BUN: 15 mg/dL (ref 8–27)
Bilirubin Total: 0.3 mg/dL (ref 0.0–1.2)
CO2: 21 mmol/L (ref 20–29)
Calcium: 9.1 mg/dL (ref 8.7–10.3)
Chloride: 101 mmol/L (ref 96–106)
Creatinine, Ser: 0.97 mg/dL (ref 0.57–1.00)
Globulin, Total: 2.7 g/dL (ref 1.5–4.5)
Glucose: 90 mg/dL (ref 65–99)
Potassium: 4.1 mmol/L (ref 3.5–5.2)
Sodium: 139 mmol/L (ref 134–144)
Total Protein: 6.7 g/dL (ref 6.0–8.5)
eGFR: 66 mL/min/{1.73_m2} (ref >59)

## 2020-08-12 LAB — IRON AND TIBC
Iron Saturation: 26 % (ref 15–55)
Iron: 60 ug/dL (ref 27–139)
Total Iron Binding Capacity: 227 ug/dL — ABNORMAL LOW (ref 250–450)
UIBC: 167 ug/dL (ref 118–369)

## 2020-08-12 LAB — HEPATITIS C ANTIBODY: Hep C Virus Ab: <0.1 s/co ratio (ref 0.0–0.9)

## 2020-08-12 LAB — FERRITIN: Ferritin: 162 ng/mL — ABNORMAL HIGH (ref 15–150)

## 2020-08-12 LAB — TSH: TSH: 2.01 u[IU]/mL (ref 0.450–4.500)

## 2020-08-18 ENCOUNTER — Encounter: Payer: BLUE CROSS/BLUE SHIELD | Admitting: Internal Medicine

## 2020-09-07 ENCOUNTER — Encounter: Payer: Self-pay | Admitting: *Deleted

## 2020-09-17 ENCOUNTER — Other Ambulatory Visit: Payer: BLUE CROSS/BLUE SHIELD

## 2020-09-17 DIAGNOSIS — Z1211 Encounter for screening for malignant neoplasm of colon: Secondary | ICD-10-CM

## 2020-09-18 LAB — FECAL OCCULT BLOOD, IMMUNOCHEMICAL: Fecal Occult Bld: NEGATIVE

## 2020-10-21 ENCOUNTER — Ambulatory Visit
Admission: RE | Admit: 2020-10-21 | Discharge: 2020-10-21 | Disposition: A | Discharge status: Home / Self Care | Payer: BLUE CROSS/BLUE SHIELD | Source: Ambulatory Visit | Attending: Internal Medicine | Admitting: Internal Medicine

## 2020-10-21 ENCOUNTER — Other Ambulatory Visit: Payer: Self-pay

## 2020-10-21 DIAGNOSIS — Z1231 Encounter for screening mammogram for malignant neoplasm of breast: Secondary | ICD-10-CM

## 2021-08-23 ENCOUNTER — Other Ambulatory Visit: Payer: Self-pay | Admitting: Internal Medicine

## 2021-08-23 DIAGNOSIS — E785 Hyperlipidemia, unspecified: Secondary | ICD-10-CM

## 2021-08-23 DIAGNOSIS — J309 Allergic rhinitis, unspecified: Secondary | ICD-10-CM

## 2021-08-23 NOTE — Telephone Encounter (Signed)
Refills for atorvastatin and cetrizine deferred until OV tomorrow.

## 2021-08-23 NOTE — Telephone Encounter (Signed)
LOV was 08/11/20. Called pt to schedule an appt - call transferred to the front office. Appt scheduled tomorrow 6/21 with Dr Montez Morita.

## 2021-08-24 ENCOUNTER — Ambulatory Visit: Payer: Self-pay | Admitting: Student

## 2021-08-24 ENCOUNTER — Other Ambulatory Visit (HOSPITAL_COMMUNITY): Payer: Self-pay

## 2021-08-24 VITALS — BP 129/61 | HR 55 | Temp 98.4°F | Wt 166.8 lb

## 2021-08-24 DIAGNOSIS — E785 Hyperlipidemia, unspecified: Secondary | ICD-10-CM

## 2021-08-24 DIAGNOSIS — G6289 Other specified polyneuropathies: Secondary | ICD-10-CM

## 2021-08-24 DIAGNOSIS — J309 Allergic rhinitis, unspecified: Secondary | ICD-10-CM

## 2021-08-24 DIAGNOSIS — R7303 Prediabetes: Secondary | ICD-10-CM

## 2021-08-24 DIAGNOSIS — Z114 Encounter for screening for human immunodeficiency virus [HIV]: Secondary | ICD-10-CM

## 2021-08-24 DIAGNOSIS — I10 Essential (primary) hypertension: Secondary | ICD-10-CM

## 2021-08-24 DIAGNOSIS — R202 Paresthesia of skin: Secondary | ICD-10-CM

## 2021-08-24 DIAGNOSIS — Z8679 Personal history of other diseases of the circulatory system: Secondary | ICD-10-CM

## 2021-08-24 LAB — POCT GLYCOSYLATED HEMOGLOBIN (HGB A1C): Hemoglobin A1C: 6.1 % — AB (ref 4.0–5.6)

## 2021-08-24 LAB — GLUCOSE, CAPILLARY: Glucose-Capillary: 89 mg/dL (ref 70–99)

## 2021-08-24 MED ORDER — LISINOPRIL-HYDROCHLOROTHIAZIDE 20-12.5 MG PO TABS
1.0000 | ORAL_TABLET | Freq: Every day | ORAL | 11 refills | Status: DC
  Filled 2021-08-24: qty 30, 30d supply, fill #0
  Filled 2021-09-21: qty 30, 30d supply, fill #1
  Filled 2021-10-23: qty 30, 30d supply, fill #2
  Filled 2021-11-23: qty 30, 30d supply, fill #3
  Filled 2022-01-04: qty 30, 30d supply, fill #4
  Filled 2022-02-06: qty 30, 30d supply, fill #5
  Filled 2022-03-12: qty 30, 30d supply, fill #6
  Filled 2022-04-12: qty 30, 30d supply, fill #7
  Filled 2022-05-24: qty 30, 30d supply, fill #8
  Filled 2022-06-29: qty 30, 30d supply, fill #9
  Filled 2022-08-02: qty 30, 30d supply, fill #10

## 2021-08-24 MED ORDER — GABAPENTIN 300 MG PO CAPS: 300.0000 mg | ORAL_CAPSULE | Freq: Every day | ORAL | 3 refills | Status: DC

## 2021-08-24 MED ORDER — GABAPENTIN 300 MG PO CAPS
300.0000 mg | ORAL_CAPSULE | Freq: Every day | ORAL | 11 refills | Status: DC
  Filled 2021-08-24: qty 30, 30d supply, fill #0

## 2021-08-24 MED ORDER — LISINOPRIL-HYDROCHLOROTHIAZIDE 20-12.5 MG PO TABS: 1.0000 | ORAL_TABLET | Freq: Every day | ORAL | 3 refills | Status: DC

## 2021-08-24 MED ORDER — ATORVASTATIN CALCIUM 20 MG PO TABS
20.0000 mg | ORAL_TABLET | Freq: Every day | ORAL | 11 refills | Status: DC
  Filled 2021-08-24: qty 30, 30d supply, fill #0
  Filled 2021-09-21: qty 30, 30d supply, fill #1
  Filled 2021-10-23: qty 30, 30d supply, fill #2
  Filled 2021-11-23: qty 30, 30d supply, fill #3
  Filled 2021-12-23: qty 30, 30d supply, fill #4
  Filled 2022-01-23: qty 30, 30d supply, fill #5
  Filled 2022-02-24: qty 30, 30d supply, fill #6
  Filled 2022-03-29: qty 30, 30d supply, fill #7
  Filled 2022-04-30: qty 30, 30d supply, fill #8
  Filled 2022-05-29: qty 30, 30d supply, fill #9
  Filled 2022-06-29: qty 30, 30d supply, fill #10
  Filled 2022-08-02: qty 30, 30d supply, fill #11

## 2021-08-24 MED ORDER — ATORVASTATIN CALCIUM 20 MG PO TABS: 20.0000 mg | ORAL_TABLET | Freq: Every day | ORAL | 3 refills | Status: DC

## 2021-08-24 NOTE — Patient Instructions (Addendum)
We will send your medications to [facility] outpatient pharmacy [address] [place], [place], [place] [address]

## 2021-08-25 DIAGNOSIS — I5020 Unspecified systolic (congestive) heart failure: Secondary | ICD-10-CM | POA: Insufficient documentation

## 2021-08-25 DIAGNOSIS — Z8679 Personal history of other diseases of the circulatory system: Secondary | ICD-10-CM | POA: Insufficient documentation

## 2021-08-25 LAB — LIPID PANEL
Chol/HDL Ratio: 3.3 ratio (ref 0.0–4.4)
Cholesterol, Total: 142 mg/dL (ref 100–199)
HDL: 43 mg/dL (ref >39)
LDL Chol Calc (NIH): 71 mg/dL (ref 0–99)
Triglycerides: 162 mg/dL — ABNORMAL HIGH (ref 0–149)
VLDL Cholesterol Cal: 28 mg/dL (ref 5–40)

## 2021-08-25 LAB — BASIC METABOLIC PANEL
BUN/Creatinine Ratio: 16 (ref 12–28)
BUN: 14 mg/dL (ref 8–27)
CO2: 25 mmol/L (ref 20–29)
Calcium: 9.3 mg/dL (ref 8.7–10.3)
Chloride: 101 mmol/L (ref 96–106)
Creatinine, Ser: 0.88 mg/dL (ref 0.57–1.00)
Glucose: 86 mg/dL (ref 70–99)
Potassium: 4.2 mmol/L (ref 3.5–5.2)
Sodium: 139 mmol/L (ref 134–144)
eGFR: 73 mL/min/{1.73_m2} (ref >59)

## 2021-08-25 LAB — HIV ANTIBODY (ROUTINE TESTING W REFLEX): HIV Screen 4th Generation wRfx: NONREACTIVE

## 2021-08-25 NOTE — Assessment & Plan Note (Signed)
Continues to have problems with this. Discussed with patient to continue her zyrtec and flonase. Also discussed using sinus rinses.

## 2021-08-25 NOTE — Assessment & Plan Note (Signed)
A1c 6.1. Stable. Could potentially benefit from metformin given her manifestations of metabolic syndrome. Elevated glucose, dyslipidemia, hypertension and obesity.

## 2021-08-25 NOTE — Assessment & Plan Note (Signed)
03/12/2008 patient presented to the hospital with dyspnea at rest, orthopnea, and paroxysmal nocturnal dyspnea.  A transthoracic echo was obtained which showed EF of 20% and diffuse hypokinesis.  Patient underwent invasive evaluation 03/13/2008.  Record not available however summary in discharge note from that time states that patient had no evidence of obstructive coronary artery disease, ventriculogram estimated EF to be 20 to 25%, and her LVEDP was 10 mmHg.  It was thought that her reduced EF could be secondary to hypertension.  Patient has had TTE in 2012 with an EF of 50% and repeat echo in 2014 with EF of 50 to 55% with no regional wall motion abnormalities.  Patient states that currently she has no dyspnea at rest or chest pain.  She continues to sleep well on 5 pillows which she has been doing for "a long time".  She also notes some paroxysmal nocturnal dyspnea.  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  On exam patient has no JVD, no appreciable additional heart sounds, no rales, and no lower extremity edema.  Although patient is mildly symptomatic this does not seem to be limiting her functional status.  She is NYHA class I-class II. Will hold on repeat TTE at this time unless patient becomes more symptomatic. Her blood pressure is currently controlled. Given previously documented diastolic dysfunction patient may benefit from SGLT2i, MRA and continuation of her ACE (ARNI likely not affordable given she is self pay).

## 2021-08-25 NOTE — Progress Notes (Signed)
   CC: Annual follow up   HPI:  [name] is a 66 y.o. F w/ PMH per below. She presents for routine follow up. Please see problem based charting under encounters tab for further details.    Past Medical History:  Diagnosis Date   CHF (congestive heart failure) ([facility])    Chronic diarrhea    Hypertension    Review of Systems:  Please see problem based charting under encounters tab for further details.    Physical Exam:  Vitals:   08/24/21 1023  BP: 129/61  Pulse: (!) 55  Temp: 98.4 F (36.9 C)  TempSrc: Oral  SpO2: 98%  Weight: 166 lb 12.8 oz (75.7 kg)   Constitutional: Well-developed, well-nourished, and in no distress.  HENT:  Head: Normocephalic and atraumatic.  Neck: Normal range of motion.  Cardiovascular: Bradycardic rate, regular rhythm, intact distal pulses. No gallop and no friction rub.  No murmur heard. No lower extremity edema  Pulmonary: Non labored breathing on room air, no wheezing or rales  Abdominal: Soft. Normal bowel sounds. Non distended and non tender Musculoskeletal: Normal range of motion.        General: No tenderness or edema.  Neurological: Alert and oriented to person, place, and time. Non focal  Skin: Skin is warm and dry.    Assessment & Plan:   See Encounters Tab for problem based charting.  Patient discussed with Dr. Oswaldo Done

## 2021-08-25 NOTE — Assessment & Plan Note (Signed)
Today's Vitals   08/24/21 1023  BP: 129/61  Pulse: (!) 55  Temp: 98.4 F (36.9 C)  TempSrc: Oral  SpO2: 98%  Weight: 166 lb 12.8 oz (75.7 kg)   Body mass index is 30.51 kg/m. Controlled will continue patient's lisinopril/HCTz 20-12.5mg  pill.

## 2021-08-25 NOTE — Assessment & Plan Note (Signed)
Lipid Panel     Component Value Date/Time   CHOL 142 08/24/2021 1121   TRIG 162 (H) 08/24/2021 1121   HDL 43 08/24/2021 1121   CHOLHDL 3.3 08/24/2021 1121   CHOLHDL 4.2 11/26/2012 1400   VLDL 56 (H) 11/26/2012 1400   LDLCALC 71 08/24/2021 1121   LABVLDL 28 08/24/2021 1121   Continue lipitor 20mg  qd.

## 2021-08-29 ENCOUNTER — Telehealth: Payer: Self-pay | Admitting: *Deleted

## 2021-08-30 NOTE — Telephone Encounter (Signed)
Forwarded to ordering provider

## 2021-09-01 NOTE — Telephone Encounter (Signed)
Attempted to call patient to discuss lab results however no answer and unable to leave voicemail. Lab work fairly unremarkable, A1c consistent with pre-diabetes. Will try again tomorrow or early next week. Would recommend she stop taking the gabapentin and see if she has resolution with that. Appears it was restarted secondary to paresthesias.

## 2021-09-21 ENCOUNTER — Other Ambulatory Visit (HOSPITAL_COMMUNITY): Payer: Self-pay

## 2021-10-24 ENCOUNTER — Other Ambulatory Visit (HOSPITAL_COMMUNITY): Payer: Self-pay

## 2021-11-24 ENCOUNTER — Other Ambulatory Visit (HOSPITAL_COMMUNITY): Payer: Self-pay

## 2021-12-26 ENCOUNTER — Other Ambulatory Visit (HOSPITAL_COMMUNITY): Payer: Self-pay

## 2022-01-04 ENCOUNTER — Other Ambulatory Visit (HOSPITAL_COMMUNITY): Payer: Self-pay

## 2022-01-24 ENCOUNTER — Other Ambulatory Visit (HOSPITAL_COMMUNITY): Payer: Self-pay

## 2022-01-25 ENCOUNTER — Other Ambulatory Visit (HOSPITAL_COMMUNITY): Payer: Self-pay

## 2022-02-06 ENCOUNTER — Other Ambulatory Visit (HOSPITAL_COMMUNITY): Payer: Self-pay

## 2022-02-24 ENCOUNTER — Other Ambulatory Visit: Payer: Self-pay

## 2022-03-13 ENCOUNTER — Other Ambulatory Visit (HOSPITAL_COMMUNITY): Payer: Self-pay

## 2022-03-13 ENCOUNTER — Other Ambulatory Visit: Payer: Self-pay

## 2022-03-30 ENCOUNTER — Other Ambulatory Visit (HOSPITAL_COMMUNITY): Payer: Self-pay

## 2022-04-13 ENCOUNTER — Other Ambulatory Visit (HOSPITAL_COMMUNITY): Payer: Self-pay

## 2022-04-13 ENCOUNTER — Other Ambulatory Visit: Payer: Self-pay

## 2022-05-01 ENCOUNTER — Other Ambulatory Visit: Payer: Self-pay

## 2022-05-25 ENCOUNTER — Other Ambulatory Visit (HOSPITAL_COMMUNITY): Payer: Self-pay

## 2022-05-31 ENCOUNTER — Other Ambulatory Visit (HOSPITAL_COMMUNITY): Payer: Self-pay

## 2022-08-02 ENCOUNTER — Other Ambulatory Visit (HOSPITAL_COMMUNITY): Payer: Self-pay

## 2022-08-04 ENCOUNTER — Other Ambulatory Visit (HOSPITAL_COMMUNITY): Payer: Self-pay

## 2022-08-14 ENCOUNTER — Emergency Department (HOSPITAL_BASED_OUTPATIENT_CLINIC_OR_DEPARTMENT_OTHER): Payer: Medicare HMO | Admitting: Radiology

## 2022-08-14 ENCOUNTER — Other Ambulatory Visit (HOSPITAL_COMMUNITY): Payer: Self-pay

## 2022-08-14 ENCOUNTER — Other Ambulatory Visit: Payer: Self-pay

## 2022-08-14 ENCOUNTER — Emergency Department (HOSPITAL_BASED_OUTPATIENT_CLINIC_OR_DEPARTMENT_OTHER)
Admission: EM | Admit: 2022-08-14 | Discharge: 2022-08-14 | Disposition: A | Discharge status: Home / Self Care | Payer: Medicare HMO | Attending: Emergency Medicine | Admitting: Emergency Medicine

## 2022-08-14 ENCOUNTER — Encounter (HOSPITAL_BASED_OUTPATIENT_CLINIC_OR_DEPARTMENT_OTHER): Payer: Self-pay | Admitting: Emergency Medicine

## 2022-08-14 DIAGNOSIS — X500XXA Overexertion from strenuous movement or load, initial encounter: Secondary | ICD-10-CM | POA: Insufficient documentation

## 2022-08-14 DIAGNOSIS — M25511 Pain in right shoulder: Secondary | ICD-10-CM | POA: Insufficient documentation

## 2022-08-14 DIAGNOSIS — Y92810 Car as the place of occurrence of the external cause: Secondary | ICD-10-CM | POA: Insufficient documentation

## 2022-08-14 MED ORDER — METHOCARBAMOL 500 MG PO TABS
500.0000 mg | ORAL_TABLET | Freq: Two times a day (BID) | ORAL | 0 refills | Status: DC
  Filled 2022-08-14: qty 5, 3d supply, fill #0

## 2022-08-14 MED ORDER — DICLOFENAC SODIUM 1 % EX GEL
4.0000 g | Freq: Four times a day (QID) | CUTANEOUS | 0 refills | Status: DC
  Filled 2022-08-14: qty 100, 6d supply, fill #0

## 2022-08-14 NOTE — Discharge Instructions (Signed)
Use the gel as prescribed Also take tylenol 1000mg (2 extra strength) four times a day.  I have given you a sling for comfort.  You do need to take your arm out of the sling at least 4 times a day and perform range of motion exercises.  Please follow-up with your family doctor hopefully in about a week and they can reassess here shoulder.  At that time they may choose to do an injection or refer you to a specialist.

## 2022-08-14 NOTE — ED Triage Notes (Signed)
Pt presents to ED POV. Pt c/o R shoulder pain x1w. Pt reports has progressively worsened. Pt reports that she was reaching into her backseat of her car when her arm got stuck and she had to jerk her arm out. Pain ever since.

## 2022-08-14 NOTE — ED Provider Notes (Signed)
[place] EMERGENCY DEPARTMENT AT [facility] Provider Note   CSN: [id number] Arrival date & time: 08/14/22  0534     History  Chief Complaint  Patient presents with   Shoulder Pain    [name] is a 66 y.o. female.  67 yo F with a chief complaint of right shoulder pain.  She reached her arm back in the car and was unable to get the arm out and so jerked it forcefully to get her arm back in position and felt like she hurt it.  Pain with movement of the arm.  Denies trauma otherwise.   Shoulder Pain      Home Medications Prior to Admission medications   Medication Sig Start Date End Date Taking? Authorizing Provider  diclofenac Sodium (VOLTAREN) 1 % GEL Apply 4 grams topically 4 (four) times daily. 08/14/22  Yes Melene Plan, DO  methocarbamol (ROBAXIN) 500 MG tablet Take 1 tablet (500 mg total) by mouth 2 (two) times daily. 08/14/22  Yes Melene Plan, DO  aspirin EC 81 MG tablet Take 81 mg by mouth daily. Swallow whole.    [provider]  atorvastatin (LIPITOR) 20 MG tablet Take 1 tablet (20 mg total) by mouth daily. 08/24/21   Marolyn Haller, MD  cetirizine (ZYRTEC) 10 MG tablet Take 1 tablet (10 mg total) by mouth at bedtime. For allergies 08/11/20   Reymundo Poll, MD  gabapentin (NEURONTIN) 300 MG capsule Take 1 capsule (300 mg total) by mouth daily. 08/24/21   Marolyn Haller, MD  lisinopril-hydrochlorothiazide (ZESTORETIC) 20-12.5 MG tablet Take 1 tablet by mouth daily. 08/24/21   Marolyn Haller, MD      Allergies    Patient has no known allergies.    Review of Systems   Review of Systems  Physical Exam Updated Vital Signs BP (!) 153/60 (BP Location: Right Arm)   Pulse (!) 57   Temp 97.8 F (36.6 C) (Oral)   Resp 18   SpO2 97%  Physical Exam Vitals and nursing note reviewed.  Constitutional:      General: She is not in acute distress.    Appearance: She is well-developed. She is not diaphoretic.  HENT:     Head: Normocephalic and  atraumatic.  Eyes:     Pupils: Pupils are equal, round, and reactive to light.  Cardiovascular:     Rate and Rhythm: Normal rate and regular rhythm.     Heart sounds: No murmur heard.    No friction rub. No gallop.  Pulmonary:     Effort: Pulmonary effort is normal.     Breath sounds: No wheezing or rales.  Abdominal:     General: There is no distension.     Palpations: Abdomen is soft.     Tenderness: There is no abdominal tenderness.  Musculoskeletal:        General: No tenderness.     Cervical back: Normal range of motion and neck supple.     Comments: No obvious bony tenderness about the clavicle AC joint or the scapula.  No obvious pain at the proximal humerus.  No pain at the elbow pulse motor and sensation intact distally.  Pain with all areas of range of motion of the shoulder.  Unable to fully assess the rotator cuff due to the discomfort.  Skin:    General: Skin is warm and dry.  Neurological:     Mental Status: She is alert and oriented to person, place, and time.  Psychiatric:  Behavior: Behavior normal.     ED Results / Procedures / Treatments   Labs (all labs ordered are listed, but only abnormal results are displayed) Labs Reviewed - No data to display  EKG None  Radiology DG Shoulder Right  Result Date: 08/14/2022 CLINICAL DATA:  Right shoulder pain for 1 week, worsening EXAM: RIGHT SHOULDER - 3 VIEW COMPARISON:  None Available. FINDINGS: Hazy calcification at the right rotator interval. No evidence of fracture, dislocation, or bone lesion. 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 Electronically Signed   By: Tiburcio Pea M.D.   On: 08/14/2022 06:56    Procedures Procedures    Medications Ordered in ED Medications - No data to display  ED Course/ Medical Decision Making/ A&P                             Medical Decision Making Risk Prescription drug management.   67 yo F with a chief complaint of right shoulder pain.   Going on for a week.  Had had an episode where her arm got stuck behind her in her car and she had trouble getting and from in between the seats and she thinks that did it.  Plain film of the shoulder independently interpreted by me without fracture or dislocation.  Sling for comfort.  PCP follow-up.  10:32 AM:  I have discussed the diagnosis/risks/treatment options with the patient and family.  Evaluation and diagnostic testing in the emergency department does not suggest an emergent condition requiring admission or immediate intervention beyond what has been performed at this time.  They will follow up with PCP. We also discussed returning to the ED immediately if new or worsening sx occur. We discussed the sx which are most concerning (e.g., sudden worsening pain, fever, inability to tolerate by mouth) that necessitate immediate return. Medications administered to the patient during their visit and any new prescriptions provided to the patient are listed below.  Medications given during this visit Medications - No data to display   The patient appears reasonably screen and/or stabilized for discharge and I doubt any other medical condition or other [facility] requiring further screening, evaluation, or treatment in the ED at this time prior to discharge.          Final Clinical Impression(s) / ED Diagnoses Final diagnoses:  Acute pain of right shoulder    Rx / DC Orders ED Discharge Orders          Ordered    diclofenac Sodium (VOLTAREN) 1 % GEL  4 times daily        08/14/22 0724    methocarbamol (ROBAXIN) 500 MG tablet  2 times daily        08/14/22 0724              Melene Plan, DO 08/14/22 1032

## 2022-08-31 ENCOUNTER — Other Ambulatory Visit (HOSPITAL_COMMUNITY): Payer: Self-pay

## 2022-08-31 ENCOUNTER — Other Ambulatory Visit: Payer: Self-pay | Admitting: Student

## 2022-08-31 DIAGNOSIS — E785 Hyperlipidemia, unspecified: Secondary | ICD-10-CM

## 2022-08-31 MED ORDER — ATORVASTATIN CALCIUM 20 MG PO TABS
20.0000 mg | ORAL_TABLET | Freq: Every day | ORAL | 3 refills | Status: DC
Start: 2022-08-31 — End: 2022-10-04
  Filled 2022-08-31: qty 90, 90d supply, fill #0

## 2022-08-31 NOTE — Telephone Encounter (Signed)
LOV 08/24/21 - pt called to schedule her annual appt; she's agreeable. Appt schedule with Dr Antony Contras on 7/31.

## 2022-09-10 ENCOUNTER — Other Ambulatory Visit: Payer: Self-pay

## 2022-09-12 ENCOUNTER — Other Ambulatory Visit (HOSPITAL_COMMUNITY): Payer: Self-pay

## 2022-09-14 ENCOUNTER — Other Ambulatory Visit: Payer: Self-pay | Admitting: Student

## 2022-09-14 ENCOUNTER — Other Ambulatory Visit (HOSPITAL_COMMUNITY): Payer: Self-pay

## 2022-09-14 DIAGNOSIS — I10 Essential (primary) hypertension: Secondary | ICD-10-CM

## 2022-09-14 MED FILL — Lisinopril & Hydrochlorothiazide Tab 20-12.5 MG: ORAL | 30 days supply | Qty: 30 | Fill #0 | Status: AC

## 2022-09-14 NOTE — Telephone Encounter (Signed)
Next appt scheduled 7/31 with Dr Antony Contras.

## 2022-09-20 ENCOUNTER — Other Ambulatory Visit (HOSPITAL_COMMUNITY): Payer: Self-pay

## 2022-10-04 ENCOUNTER — Encounter: Payer: Self-pay | Admitting: Internal Medicine

## 2022-10-04 ENCOUNTER — Other Ambulatory Visit (HOSPITAL_COMMUNITY): Payer: Self-pay

## 2022-10-04 ENCOUNTER — Ambulatory Visit (INDEPENDENT_AMBULATORY_CARE_PROVIDER_SITE_OTHER): Payer: Medicare HMO

## 2022-10-04 ENCOUNTER — Other Ambulatory Visit: Payer: Self-pay

## 2022-10-04 ENCOUNTER — Ambulatory Visit (INDEPENDENT_AMBULATORY_CARE_PROVIDER_SITE_OTHER): Payer: Medicare HMO | Admitting: Internal Medicine

## 2022-10-04 VITALS — BP 130/51 | HR 57 | Temp 98.0°F | Ht 62.0 in | Wt 169.7 lb

## 2022-10-04 DIAGNOSIS — R0609 Other forms of dyspnea: Secondary | ICD-10-CM

## 2022-10-04 DIAGNOSIS — J309 Allergic rhinitis, unspecified: Secondary | ICD-10-CM | POA: Diagnosis not present

## 2022-10-04 DIAGNOSIS — Z23 Encounter for immunization: Secondary | ICD-10-CM

## 2022-10-04 DIAGNOSIS — E785 Hyperlipidemia, unspecified: Secondary | ICD-10-CM | POA: Diagnosis not present

## 2022-10-04 DIAGNOSIS — E2839 Other primary ovarian failure: Secondary | ICD-10-CM | POA: Diagnosis not present

## 2022-10-04 DIAGNOSIS — Z0000 Encounter for general adult medical examination without abnormal findings: Secondary | ICD-10-CM

## 2022-10-04 DIAGNOSIS — Z1211 Encounter for screening for malignant neoplasm of colon: Secondary | ICD-10-CM

## 2022-10-04 DIAGNOSIS — I10 Essential (primary) hypertension: Secondary | ICD-10-CM

## 2022-10-04 MED ORDER — ASPIRIN 81 MG PO TBEC
81.0000 mg | DELAYED_RELEASE_TABLET | Freq: Every day | ORAL | 12 refills | Status: AC
Start: 2022-10-04 — End: ?
  Filled 2022-10-04: qty 30, 30d supply, fill #0

## 2022-10-04 MED ORDER — LISINOPRIL-HYDROCHLOROTHIAZIDE 20-12.5 MG PO TABS
1.0000 | ORAL_TABLET | Freq: Every day | ORAL | 11 refills | Status: DC
Start: 2022-10-04 — End: 2023-11-01
  Filled 2022-10-04 – 2022-11-01 (×2): qty 30, 30d supply, fill #0
  Filled 2023-01-03: qty 30, 30d supply, fill #1
  Filled 2023-02-04: qty 30, 30d supply, fill #2
  Filled 2023-03-12: qty 30, 30d supply, fill #3
  Filled 2023-04-12: qty 30, 30d supply, fill #4
  Filled 2023-05-16: qty 30, 30d supply, fill #5
  Filled 2023-07-02: qty 30, 30d supply, fill #6
  Filled 2023-08-09: qty 30, 30d supply, fill #7
  Filled 2023-09-23: qty 30, 30d supply, fill #8

## 2022-10-04 MED ORDER — ATORVASTATIN CALCIUM 20 MG PO TABS
20.0000 mg | ORAL_TABLET | Freq: Every day | ORAL | 3 refills | Status: DC
Start: 2022-10-04 — End: 2023-11-28
  Filled 2022-10-04 – 2022-11-29 (×2): qty 90, 90d supply, fill #0
  Filled 2023-02-24: qty 90, 90d supply, fill #1
  Filled 2023-05-31: qty 90, 90d supply, fill #2
  Filled 2023-08-30: qty 90, 90d supply, fill #3

## 2022-10-04 NOTE — Assessment & Plan Note (Signed)
Discussed screening for osteoporosis with DEXA; she is open to treatment if results are concerning for low bone mineral density. She has never had a fracture. Order placed.

## 2022-10-04 NOTE — Progress Notes (Signed)
Subjective:   [name] is a 67 y.o. female who presents for an Initial Medicare Annual Wellness Visit.  Visit Complete: In person.   Review of Systems    Defer to PCP.        Objective:    Today's Vitals   10/04/22 1432  BP: (!) 130/51  Pulse: (!) 57  Temp: 98 F (36.7 C)  TempSrc: Oral  SpO2: 98%  Weight: 169 lb 11.2 oz (77 kg)  Height: 5\' 2"  (1.575 m)   Body mass index is 31.04 kg/m.     10/04/2022    2:37 PM 10/04/2022    9:25 AM 08/14/2022    5:43 AM 08/24/2021   10:25 AM 08/11/2020   10:05 AM 08/06/2019    8:32 AM 04/09/2019    8:33 AM  Advanced Directives  Does Patient Have a Medical Advance Directive? No No No No No No No  Would patient like information on creating a medical advance directive? No - Patient declined No - Patient declined No - Patient declined No - Patient declined No - Patient declined No - Patient declined No - Patient declined    Current Medications (verified) Outpatient Encounter Medications as of 10/04/2022  Medication Sig   aspirin EC 81 MG tablet Take 1 tablet (81 mg total) by mouth daily. Swallow whole.   atorvastatin (LIPITOR) 20 MG tablet Take 1 tablet (20 mg total) by mouth daily.   cetirizine (ZYRTEC) 10 MG tablet Take 1 tablet (10 mg total) by mouth at bedtime. For allergies   lisinopril-hydrochlorothiazide (ZESTORETIC) 20-12.5 MG tablet Take 1 tablet by mouth daily.   No facility-administered encounter medications on file as of 10/04/2022.    Allergies (verified) Patient has no known allergies.   History: Past Medical History:  Diagnosis Date   CHF (congestive heart failure) (HCC)    Chronic diarrhea    Hypertension    Past Surgical History:  Procedure Laterality Date   ABDOMINAL HYSTERECTOMY     ABDOMINAL HYSTERECTOMY W/ PARTIAL VAGINACTOMY  2000   for fibroids   CARDIAC CATHETERIZATION  2010   Family History  Problem Relation Age of Onset   Heart attack Brother 57   Heart disease Father 16       cath in his  55s.    Diabetes Father    Diabetes Brother    Breast cancer Mother 62   Social History   Socioeconomic History   Marital status: Married    Spouse name: Not on file   Number of children: Not on file   Years of education: Not on file   Highest education level: Not on file  Occupational History   Not on file  Tobacco Use   Smoking status: Never   Smokeless tobacco: Never  Substance and Sexual Activity   Alcohol use: No    Alcohol/week: 0.0 standard drinks of alcohol   Drug use: No   Sexual activity: Yes  Other Topics Concern   Not on file  Social History Narrative   Married and lives with husband in [place].  2 boys and 1 girl.  Former Associate Professor.  High school graduate with some college but no degree (for education).     Social Determinants of Health   Financial Resource Strain: Low Risk  (10/04/2022)   Overall Financial Resource Strain (CARDIA)    Difficulty of Paying Living Expenses: Not hard at all  Food Insecurity: No Food Insecurity (10/04/2022)   Hunger Vital Sign    Worried  About Running Out of Food in the Last Year: Never true    Ran Out of Food in the Last Year: Never true  Transportation Needs: No Transportation Needs (10/04/2022)   PRAPARE - Administrator, Civil Service (Medical): No    Lack of Transportation (Non-Medical): No  Physical Activity: Inactive (10/04/2022)   Exercise Vital Sign    Days of Exercise per Week: 0 days    Minutes of Exercise per Session: 0 min  Stress: No Stress Concern Present (10/04/2022)   [employer] of Occupational Health - Occupational Stress Questionnaire    Feeling of Stress : Only a little  Social Connections: Moderately Isolated (10/04/2022)   Social Connection and Isolation Panel [NHANES]    Frequency of Communication with Friends and Family: More than three times a week    Frequency of Social Gatherings with Friends and Family: Once a week    Attends Religious Services: More than 4 times per year     Active Member of [employer] or Organizations: No    Attends Banker Meetings: Never    Marital Status: Widowed    Tobacco Counseling Counseling given: Not Answered   Clinical Intake:  Pre-visit preparation completed: Yes  Pain : No/denies pain     BMI - recorded: 31.04 Nutritional Status: BMI > 30  Obese Nutritional Risks: None Diabetes: No  How often do you need to have someone help you when you read instructions, pamphlets, or other written materials from your doctor or pharmacy?: 1 - Never What is the last grade level you completed in school?: some college  Interpreter Needed?: No  Information entered by :: Toddrick Sanna, CMA 10/04/2022   Activities of Daily Living    10/04/2022    2:34 PM 10/04/2022    9:25 AM  In your present state of health, do you have any difficulty performing the following activities:  Hearing? 0 0  Vision? 0 0  Difficulty concentrating or making decisions? 0 0  Walking or climbing stairs? 0 0  Dressing or bathing? 0 0  Doing errands, shopping? 0 0    Patient Care Team: Reymundo Poll, MD as PCP - General (Internal Medicine)  Indicate any recent Medical Services you may have received from other than [facility] providers in the past year (date may be approximate).     Assessment:   This is a routine wellness examination for Luan.  Hearing/Vision screen No results found.  Dietary issues and exercise activities discussed:     Goals Addressed   None   Depression Screen    10/04/2022    2:36 PM 10/04/2022    9:25 AM 08/24/2021   10:26 AM 08/11/2020   10:04 AM 08/06/2019    8:31 AM 04/09/2019    8:33 AM 11/06/2018    8:50 AM  PHQ 2/9 Scores  PHQ - 2 Score 0 0 0 0 0 0 0    Fall Risk    10/04/2022    2:37 PM 10/04/2022    9:25 AM 08/24/2021   10:24 AM 08/11/2020   10:04 AM 08/06/2019    8:31 AM  Fall Risk   Falls in the past year? 0 0 0 0 0  Number falls in past yr: 0 0 0    Injury with Fall? 0 0 0    Risk for fall due to : No Fall  Risks No Fall Risks No Fall Risks No Fall Risks No Fall Risks  Follow up Falls evaluation completed;Falls prevention discussed  Falls evaluation completed;Falls prevention discussed Falls evaluation completed  Falls prevention discussed    MEDICARE RISK AT HOME:  Medicare Risk at Home - 10/04/22 1438     Any stairs in or around the home? Yes    If so, are there any without handrails? No    Home free of loose throw rugs in walkways, pet beds, electrical cords, etc? Yes    Adequate lighting in your home to reduce risk of falls? Yes    Life alert? No    Use of a cane, walker or w/c? No    Grab bars in the bathroom? No    Shower chair or bench in shower? No    Elevated toilet seat or a handicapped toilet? No             TIMED UP AND GO:  Was the test performed? No    Cognitive Function:        Immunizations Immunization History  Administered Date(s) Administered   Influenza,inj,Quad PF,6+ Mos 05/06/2018, 04/09/2019   PNEUMOCOCCAL CONJUGATE-20 10/04/2022   Pneumococcal Polysaccharide-23 06/14/2012   Tdap 06/14/2012    TDAP status: Due, Education has been provided regarding the importance of this vaccine. Advised may receive this vaccine at local pharmacy or Health Dept. Aware to provide a copy of the vaccination record if obtained from local pharmacy or Health Dept. Verbalized acceptance and understanding.  Flu Vaccine status: Due, Education has been provided regarding the importance of this vaccine. Advised may receive this vaccine at local pharmacy or Health Dept. Aware to provide a copy of the vaccination record if obtained from local pharmacy or Health Dept. Verbalized acceptance and understanding.  Pneumococcal vaccine status: Up to date  Covid-19 vaccine status: Information provided on how to obtain vaccines.   Qualifies for Shingles Vaccine? Yes   Zostavax completed No   Shingrix Completed?: No.    Education has been provided regarding the importance of this  vaccine. Patient has been advised to call insurance company to determine out of pocket expense if they have not yet received this vaccine. Advised may also receive vaccine at local pharmacy or Health Dept. Verbalized acceptance and understanding.  Screening Tests Health Maintenance  Topic Date Due   COVID-19 Vaccine (1) Never done   Zoster Vaccines- Shingrix (1 of 2) Never done   COLON CANCER SCREENING ANNUAL FOBT  09/17/2021   DEXA SCAN  Never done   DTaP/Tdap/Td (2 - Td or Tdap) 06/15/2022   INFLUENZA VACCINE  10/05/2022   MAMMOGRAM  10/22/2022   Medicare Annual Wellness (AWV)  10/04/2023   Pneumonia Vaccine 11+ Years old  Completed   Hepatitis C Screening  Completed   HIV Screening  Completed   HPV VACCINES  Aged Out   Colonoscopy  Discontinued    Health Maintenance  Health Maintenance Due  Topic Date Due   COVID-19 Vaccine (1) Never done   Zoster Vaccines- Shingrix (1 of 2) Never done   COLON CANCER SCREENING ANNUAL FOBT  09/17/2021   DEXA SCAN  Never done   DTaP/Tdap/Td (2 - Td or Tdap) 06/15/2022    Colorectal cancer screening: Type of screening: FOBT/FIT. Completed 09/17/2020. Repeat every 1 years  Mammogram status: Upcoming appt 10/22/2022. Bone Density status: Ordered 10/04/2022. Pt provided with contact info and advised to call to schedule appt.  Lung Cancer Screening: (Low Dose CT Chest recommended if Age 72-80 years, 20 pack-year currently smoking OR have quit w/in 15years.) does not qualify.   Lung Cancer Screening Referral:  Defer to PCP.   Additional Screening:  Hepatitis C Screening: does qualify; Completed 08/11/2020.  Vision Screening: Recommended annual ophthalmology exams for early detection of glaucoma and other disorders of the eye. Is the patient up to date with their annual eye exam?  No  Who is the provider or what is the name of the office in which the patient attends annual eye exams? Defer to PCP.  If pt is not established with a provider, would  they like to be referred to a provider to establish care? No .   Dental Screening: Recommended annual dental exams for proper oral hygiene   Community Resource Referral / Chronic Care Management: CRR required this visit?  No   CCM required this visit?  No     Plan:     I have personally reviewed and noted the following in the patient's chart:   Medical and social history Use of alcohol, tobacco or illicit drugs  Current medications and supplements including opioid prescriptions. Patient is not currently taking opioid prescriptions. Functional ability and status Nutritional status Physical activity Advanced directives List of other physicians Hospitalizations, surgeries, and ER visits in previous 12 months Vitals Screenings to include cognitive, depression, and falls Referrals and appointments  In addition, I have reviewed and discussed with patient certain preventive protocols, quality metrics, and best practice recommendations. A written personalized care plan for preventive services as well as general preventive health recommendations were provided to patient.     Dmarion Perfect, CMA   10/04/2022   After Visit Summary: (Mail) Due to this being a telephonic visit, the after visit summary with patients personalized plan was offered to patient via mail   Nurse Notes: Face to Face.  Ms. Goblirsch , Thank you for taking time to come for your Medicare Wellness Visit. I appreciate your ongoing commitment to your health goals. Please review the following plan we discussed and let me know if I can assist you in the future.   These are the goals we discussed:  Goals   None     This is a list of the screening recommended for you and due dates:  Health Maintenance  Topic Date Due   COVID-19 Vaccine (1) Never done   Zoster (Shingles) Vaccine (1 of 2) Never done   Stool Blood Test  09/17/2021   DEXA scan (bone density measurement)  Never done   DTaP/Tdap/Td vaccine (2 - Td or Tdap)  06/15/2022   Flu Shot  10/05/2022   Mammogram  10/22/2022   Medicare Annual Wellness Visit  10/04/2023   Pneumonia Vaccine  Completed   Hepatitis C Screening  Completed   HIV Screening  Completed   HPV Vaccine  Aged Out   Colon Cancer Screening  Discontinued

## 2022-10-04 NOTE — Assessment & Plan Note (Signed)
Chronic and well controlled. Refilled lisinopril-hydrochlorothiazide. Checking BMP.

## 2022-10-04 NOTE — Patient Instructions (Signed)
Ms. Hutzel,  It was a pleasure to see you today. Please continue to take your medications as prescribed. I will call you with the results of your blood work today.   I have a ordered a DEXA scan to screen you for osteoporosis (weak bones). You will be called to schedule this. I have also ordered a stool kit to screen for colon cancer. Follow the instructions in the kit to return the sample.   Follow up with me again in 6 months or sooner if you have any issues.   If you have any questions or concerns, call our clinic at [phone] or after hours call [phone] and ask for the internal medicine resident on call.   Thank you!  Dr.G

## 2022-10-04 NOTE — Addendum Note (Signed)
Addended by: Hassan Buckler on: 10/04/2022 10:28 AM   Modules accepted: Orders

## 2022-10-04 NOTE — Assessment & Plan Note (Signed)
Ordered immunochemical FOBT; last screening 2 years ago was normal.

## 2022-10-04 NOTE — Progress Notes (Addendum)
Subjective:   Patient ID: [name] female   DOB: 1955-07-21 67 y.o.   MRN: [id number]  HPI: [name] is a 67 y.o. female with past medical history outlined below here for follow up of her chronic conditions as well as acute complaint of DOE.   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 Noticed increased shortness of breath with exertion. Denies chest pain, no swelling. Had chronic orthopnea and sleeping with 5 pillows now.   Otherwise she is doing well. Taking her lisinopril-hydrochlorothiazide and atorvastatin daily.    Past Medical History:  Diagnosis Date   CHF (congestive heart failure) (HCC)    Chronic diarrhea    Hypertension    Current Outpatient Medications  Medication Sig Dispense Refill   aspirin EC 81 MG tablet Take 1 tablet (81 mg total) by mouth daily. Swallow whole. 30 tablet 12   atorvastatin (LIPITOR) 20 MG tablet Take 1 tablet (20 mg total) by mouth daily. 90 tablet 3   cetirizine (ZYRTEC) 10 MG tablet Take 1 tablet (10 mg total) by mouth at bedtime. For allergies 90 tablet 3   lisinopril-hydrochlorothiazide (ZESTORETIC) 20-12.5 MG tablet Take 1 tablet by mouth daily. 30 tablet 11   No current facility-administered medications for this visit.   Family History  Problem Relation Age of Onset   Heart attack Brother 3   Heart disease Father 78       cath in his 1s.    Diabetes Father    Diabetes Brother    Breast cancer Mother 66   Social History   Socioeconomic History   Marital status: Married    Spouse name: Not on file   Number of children: Not on file   Years of education: Not on file   Highest education level: Not on file  Occupational History   Not on file  Tobacco Use   Smoking status: Never   Smokeless tobacco: Never  Substance and Sexual Activity   Alcohol use: No    Alcohol/week: 0.0 standard drinks of alcohol   Drug use: No   Sexual activity: Yes  Other Topics Concern   Not on file  Social History  Narrative   Married and lives with husband in [place].  2 boys and 1 girl.  Former Associate Professor.  High school graduate with some college but no degree (for education).     Social Determinants of Health   Financial Resource Strain: Not on file  Food Insecurity: Not on file  Transportation Needs: Not on file  Physical Activity: Not on file  Stress: Not on file  Social Connections: Not on file     Objective:  Physical Exam:  Vitals:   10/04/22 0923  BP: (!) 130/51  Pulse: (!) 57  Temp: 98 F (36.7 C)  TempSrc: Oral  SpO2: 98%  Weight: 169 lb 11.2 oz (77 kg)  Height: 5\' 2"  (1.575 m)    Constitutional: Well appearing, NAD HEENT: Clear oropharynx, no LAD Cardiovascular: RRR, no m/r/g Pulmonary/Chest: Clear, normal effort Extremities: Warm, no edema    Assessment & Plan:   Hyperlipidemia Refilled lipitor 20. Rechecking lipid panel today.   Estrogen deficiency Discussed screening for osteoporosis with DEXA; she is open to treatment if results are concerning for low bone mineral density. She has never had a fracture. Order placed.   Essential hypertension, benign Chronic and well controlled. Refilled lisinopril-hydrochlorothiazide. Checking BMP.   Colon cancer screening Ordered immunochemical FOBT; last screening 2 years ago  was normal.   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 No chest pain. Has 5 pillow orthopnea, but no PND. No LE swelling or other signs of heart failure.She is a non smoker.  Lung are clear. Will start with repeat echocardiogram for initial evaluation.

## 2022-10-04 NOTE — Assessment & Plan Note (Signed)
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 No chest pain. Has 5 pillow orthopnea, but no PND. No LE swelling or other signs of heart failure.She is a non smoker.  Lung are clear. Will start with repeat echocardiogram for initial evaluation.

## 2022-10-04 NOTE — Assessment & Plan Note (Signed)
Refilled lipitor 20. Rechecking lipid panel today.

## 2022-10-04 NOTE — Patient Instructions (Signed)

## 2022-10-06 ENCOUNTER — Other Ambulatory Visit: Payer: Medicare HMO

## 2022-10-06 DIAGNOSIS — Z1211 Encounter for screening for malignant neoplasm of colon: Secondary | ICD-10-CM

## 2022-10-06 NOTE — Progress Notes (Signed)
Internal Medicine Clinic Attending  Case, documentation, and findings reviewed. I agree with the assessment, diagnosis, and plan of care as outlined in the AWV note.     

## 2022-10-06 NOTE — Addendum Note (Signed)
Addended by: Burnell Blanks on: 10/06/2022 08:44 AM   Modules accepted: Level of Service

## 2022-10-16 ENCOUNTER — Other Ambulatory Visit (HOSPITAL_COMMUNITY): Payer: Self-pay

## 2022-11-01 ENCOUNTER — Other Ambulatory Visit (HOSPITAL_COMMUNITY): Payer: Self-pay

## 2022-11-03 ENCOUNTER — Emergency Department (HOSPITAL_BASED_OUTPATIENT_CLINIC_OR_DEPARTMENT_OTHER)
Admission: EM | Admit: 2022-11-03 | Discharge: 2022-11-03 | Disposition: A | Discharge status: Home / Self Care | Payer: Medicare HMO | Attending: Emergency Medicine | Admitting: Emergency Medicine

## 2022-11-03 ENCOUNTER — Encounter (HOSPITAL_BASED_OUTPATIENT_CLINIC_OR_DEPARTMENT_OTHER): Payer: Self-pay | Admitting: Emergency Medicine

## 2022-11-03 ENCOUNTER — Other Ambulatory Visit: Payer: Self-pay | Admitting: Internal Medicine

## 2022-11-03 ENCOUNTER — Other Ambulatory Visit: Payer: Self-pay

## 2022-11-03 ENCOUNTER — Ambulatory Visit (HOSPITAL_COMMUNITY)
Admission: RE | Admit: 2022-11-03 | Discharge: 2022-11-03 | Disposition: A | Discharge status: Home / Self Care | Payer: Medicare HMO | Source: Ambulatory Visit | Attending: Internal Medicine | Admitting: Internal Medicine

## 2022-11-03 DIAGNOSIS — Z1211 Encounter for screening for malignant neoplasm of colon: Secondary | ICD-10-CM

## 2022-11-03 DIAGNOSIS — Z7982 Long term (current) use of aspirin: Secondary | ICD-10-CM | POA: Insufficient documentation

## 2022-11-03 DIAGNOSIS — E2839 Other primary ovarian failure: Secondary | ICD-10-CM

## 2022-11-03 DIAGNOSIS — R0609 Other forms of dyspnea: Secondary | ICD-10-CM | POA: Diagnosis not present

## 2022-11-03 DIAGNOSIS — R0600 Dyspnea, unspecified: Secondary | ICD-10-CM | POA: Insufficient documentation

## 2022-11-03 DIAGNOSIS — E785 Hyperlipidemia, unspecified: Secondary | ICD-10-CM

## 2022-11-03 DIAGNOSIS — R21 Rash and other nonspecific skin eruption: Secondary | ICD-10-CM | POA: Diagnosis present

## 2022-11-03 DIAGNOSIS — J309 Allergic rhinitis, unspecified: Secondary | ICD-10-CM

## 2022-11-03 DIAGNOSIS — Z0000 Encounter for general adult medical examination without abnormal findings: Secondary | ICD-10-CM

## 2022-11-03 DIAGNOSIS — I10 Essential (primary) hypertension: Secondary | ICD-10-CM

## 2022-11-03 DIAGNOSIS — I517 Cardiomegaly: Secondary | ICD-10-CM | POA: Insufficient documentation

## 2022-11-03 NOTE — ED Notes (Signed)
Discharge instructions, follow up care, and OTC antihistamine use reviewed and explained, pt verbalized understanding and had no further questions on d/c. Pt caox4, ambulatory, NAD on d/c.

## 2022-11-03 NOTE — ED Triage Notes (Signed)
Pt c/o rash and swelling to L ankle that began 3-4d ago. Pt reports that she was talking through tall grass when she felt like something may have bitten her.

## 2022-11-03 NOTE — ED Provider Notes (Signed)
[place] EMERGENCY DEPARTMENT AT [facility] Provider Note   CSN: [id number] Arrival date & time: 11/03/22  1307     History  Chief Complaint  Patient presents with   Insect Bite    [name] is a 67 y.o. female.  67 yo F with a chief complaints of a rash to bilateral legs.  She tells me that she was at a friend's house and was walking through the grass and she fell like she was being bit by something.  Since then it has been a bit itchy.  She thinks it may be swollen as well.        Home Medications Prior to Admission medications   Medication Sig Start Date End Date Taking? Authorizing Provider  aspirin EC 81 MG tablet Take 1 tablet (81 mg total) by mouth daily. Swallow whole. 10/04/22   Reymundo Poll, MD  atorvastatin (LIPITOR) 20 MG tablet Take 1 tablet (20 mg total) by mouth daily. 10/04/22   Reymundo Poll, MD  cetirizine (ZYRTEC) 10 MG tablet Take 1 tablet (10 mg total) by mouth at bedtime. For allergies 08/11/20   Reymundo Poll, MD  lisinopril-hydrochlorothiazide (ZESTORETIC) 20-12.5 MG tablet Take 1 tablet by mouth daily. 10/04/22   Reymundo Poll, MD      Allergies    Patient has no known allergies.    Review of Systems   Review of Systems  Physical Exam Updated Vital Signs BP 133/83 (BP Location: Right Arm)   Pulse 62   Temp 98 F (36.7 C) (Oral)   Resp 15   SpO2 98%  Physical Exam Vitals and nursing note reviewed.  Constitutional:      General: She is not in acute distress.    Appearance: She is well-developed. She is not diaphoretic.  HENT:     Head: Normocephalic and atraumatic.  Eyes:     Pupils: Pupils are equal, round, and reactive to light.  Cardiovascular:     Rate and Rhythm: Normal rate and regular rhythm.     Heart sounds: No murmur heard.    No friction rub. No gallop.  Pulmonary:     Effort: Pulmonary effort is normal.     Breath sounds: No wheezing or rales.  Abdominal:     General: There is no  distension.     Palpations: Abdomen is soft.     Tenderness: There is no abdominal tenderness.  Musculoskeletal:        General: No tenderness.     Cervical back: Normal range of motion and neck supple.  Skin:    General: Skin is warm and dry.     Comments: Small erythematous lesions that are blanching to the inside of bilateral lower extremities.  Pulse and sensation and motor intact.  I do not appreciate any obvious edema.  Neurological:     Mental Status: She is alert and oriented to person, place, and time.  Psychiatric:        Behavior: Behavior normal.     ED Results / Procedures / Treatments   Labs (all labs ordered are listed, but only abnormal results are displayed) Labs Reviewed - No data to display  EKG None  Radiology No results found.  Procedures Procedures    Medications Ordered in ED Medications - No data to display  ED Course/ Medical Decision Making/ A&P  Medical Decision Making  67 yo F with a chief complaints of concern that she was bit by insects.  This occurred a couple days ago.  On exam I do agree its most consistent with insect bites.  I encouraged her to take antihistamines over-the-counter.  Will have her follow-up with her family doctor.  I told her if she is having new rash or spread with a rash than likely she needs to have an exterminator evaluate where she lives.   2:29 PM:  I have discussed the diagnosis/risks/treatment options with the patient.  Evaluation and diagnostic testing in the emergency department does not suggest an emergent condition requiring admission or immediate intervention beyond what has been performed at this time.  They will follow up with PCP. We also discussed returning to the ED immediately if new or worsening sx occur. We discussed the sx which are most concerning (e.g., sudden worsening pain, fever, inability to tolerate by mouth) that necessitate immediate return. Medications  administered to the patient during their visit and any new prescriptions provided to the patient are listed below.  Medications given during this visit Medications - No data to display   The patient appears reasonably screen and/or stabilized for discharge and I doubt any other medical condition or other [facility] requiring further screening, evaluation, or treatment in the ED at this time prior to discharge.          Final Clinical Impression(s) / ED Diagnoses Final diagnoses:  Rash    Rx / DC Orders ED Discharge Orders     None         Melene Plan, DO 11/03/22 1429

## 2022-11-03 NOTE — Discharge Instructions (Signed)
I think your rash is most consistent with insect bites.  You can take an oral antihistamine and use a topical antihistamine as you need to to help you with itching.  If you are worried that the rash is spreading then you could be getting bit by insects that are still living where you live.  It might be worth having an exterminator come and evaluate the area.

## 2022-11-05 LAB — ECHOCARDIOGRAM COMPLETE
AR max vel: 1.93 cm2
AV Area VTI: 1.77 cm2
AV Area mean vel: 1.92 cm2
AV Mean grad: 3 mmHg
AV Peak grad: 4.8 mmHg
Ao pk vel: 1.09 m/s
Area-P 1/2: 4.44 cm2
S' Lateral: 3.3 cm

## 2022-11-18 ENCOUNTER — Emergency Department (HOSPITAL_BASED_OUTPATIENT_CLINIC_OR_DEPARTMENT_OTHER)
Admission: EM | Admit: 2022-11-18 | Discharge: 2022-11-18 | Disposition: A | Discharge status: Home / Self Care | Payer: Medicare HMO | Attending: Emergency Medicine | Admitting: Emergency Medicine

## 2022-11-18 ENCOUNTER — Encounter (HOSPITAL_BASED_OUTPATIENT_CLINIC_OR_DEPARTMENT_OTHER): Payer: Self-pay

## 2022-11-18 ENCOUNTER — Other Ambulatory Visit (HOSPITAL_BASED_OUTPATIENT_CLINIC_OR_DEPARTMENT_OTHER): Payer: Self-pay

## 2022-11-18 ENCOUNTER — Other Ambulatory Visit: Payer: Self-pay

## 2022-11-18 DIAGNOSIS — K047 Periapical abscess without sinus: Secondary | ICD-10-CM | POA: Diagnosis not present

## 2022-11-18 DIAGNOSIS — K0889 Other specified disorders of teeth and supporting structures: Secondary | ICD-10-CM | POA: Diagnosis present

## 2022-11-18 DIAGNOSIS — Z7982 Long term (current) use of aspirin: Secondary | ICD-10-CM | POA: Insufficient documentation

## 2022-11-18 DIAGNOSIS — K0381 Cracked tooth: Secondary | ICD-10-CM | POA: Insufficient documentation

## 2022-11-18 MED ORDER — AMOXICILLIN-POT CLAVULANATE 875-125 MG PO TABS
1.0000 | ORAL_TABLET | Freq: Two times a day (BID) | ORAL | 0 refills | Status: DC
  Filled 2022-11-18: qty 14, 7d supply, fill #0

## 2022-11-18 NOTE — ED Triage Notes (Signed)
In for eval of dental pain onset Wednesday. Has been taking tylenol, ibuprofen, and oragel without relief.

## 2022-11-18 NOTE — Discharge Instructions (Signed)
Please read and follow all provided instructions.  Your diagnoses today include:  1. Dental infection     The exam and treatment you received today has been provided on an emergency basis only. This is not a substitute for complete medical or dental care.  Tests performed today include: Vital signs. See below for your results today.   Medications prescribed:  Augmentin - antibiotic  You have been prescribed an antibiotic medicine: take the entire course of medicine even if you are feeling better. Stopping early can cause the antibiotic not to work.  Please use over-the-counter NSAID medications (ibuprofen, naproxen) or Tylenol (acetaminophen) as directed on the packaging for pain -- as long as you do not have any reasons avoid these medications. Reasons to avoid NSAID medications include: weak kidneys, a history of bleeding in your stomach or gut, or uncontrolled high blood pressure or previous heart attack. Reasons to avoid Tylenol include: liver problems or ongoing alcohol use. Never take more than 4000mg  or 8 Extra strength Tylenol in a 24 hour period.     Take any prescribed medications only as directed.  Home care instructions:  Follow any educational materials contained in this packet.  Follow-up instructions: Please follow-up with your dentist for further evaluation of your symptoms.   Dental Assistance: See attached dental referral and/or resource guide.   Return instructions:  Please return to the Emergency Department if you experience worsening symptoms. Please return if you develop a fever, you develop more swelling in your face or neck, you have trouble breathing or swallowing food. Please return if you have any other emergent concerns.  Additional Information:  Your vital signs today were: BP (!) 149/83 (BP Location: Right Arm)   Pulse 65   Temp 97.7 F (36.5 C) (Oral)   Resp 16   Ht 5\' 2"  (1.575 m)   Wt 74.8 kg   SpO2 98%   BMI 30.18 kg/m  If your blood  pressure (BP) was elevated above 135/85 this visit, please have this repeated by your doctor within one month. --------------

## 2022-11-18 NOTE — ED Notes (Signed)
Patient verbalizes understanding of discharge instructions. Opportunity for questioning and answers were provided. Patient discharged from ED.  °

## 2022-11-18 NOTE — ED Notes (Signed)
Pt notes swelling to the left back gums and pain to the left back molar.

## 2022-11-18 NOTE — ED Provider Notes (Signed)
[place] EMERGENCY DEPARTMENT AT [facility] Provider Note   CSN: [id number] Arrival date & time: 11/18/22  1047     History  Chief Complaint  Patient presents with   Dental Pain    [name] is a 67 y.o. female.  Patient presents to the emergency department today for evaluation of left lower dental pain and swelling.  Symptoms started about 3 days ago.  She has a large cavity that she knows about.  No neck swelling, difficulty breathing or swallowing.  She has been taking over-the-counter NSAIDs and Tylenol as well as using Orajel.  No ear pain or drainage.  No fevers.       Home Medications Prior to Admission medications   Medication Sig Start Date End Date Taking? Authorizing Provider  amoxicillin-clavulanate (AUGMENTIN) 875-125 MG tablet Take 1 tablet by mouth every 12 (twelve) hours. 11/18/22  Yes Renne Crigler, PA-C  aspirin EC 81 MG tablet Take 1 tablet (81 mg total) by mouth daily. Swallow whole. 10/04/22   Reymundo Poll, MD  atorvastatin (LIPITOR) 20 MG tablet Take 1 tablet (20 mg total) by mouth daily. 10/04/22   Reymundo Poll, MD  cetirizine (ZYRTEC) 10 MG tablet Take 1 tablet (10 mg total) by mouth at bedtime. For allergies 08/11/20   Reymundo Poll, MD  lisinopril-hydrochlorothiazide (ZESTORETIC) 20-12.5 MG tablet Take 1 tablet by mouth daily. 10/04/22   Reymundo Poll, MD      Allergies    Patient has no known allergies.    Review of Systems   Review of Systems  Physical Exam Updated Vital Signs BP (!) 149/83 (BP Location: Right Arm)   Pulse 65   Temp 97.7 F (36.5 C) (Oral)   Resp 16   Ht 5\' 2"  (1.575 m)   Wt 74.8 kg   SpO2 98%   BMI 30.18 kg/m  Physical Exam Vitals and nursing note reviewed.  Constitutional:      Appearance: She is well-developed.  HENT:     Head: Normocephalic and atraumatic.     Jaw: No trismus.     Right Ear: Tympanic membrane, ear canal and external ear normal.     Left Ear: Tympanic membrane, ear  canal and external ear normal.     Nose: Nose normal.     Mouth/Throat:     Mouth: Mucous membranes are moist.     Dentition: Abnormal dentition. Dental caries present. No dental abscesses.     Pharynx: Uvula midline. No uvula swelling.     Tonsils: No tonsillar abscesses.     Comments: Patient with large cavity in tooth #19.  There is trace swelling over the lower jaw without palpable abscess.  Patient with other previous extractions. Eyes:     Conjunctiva/sclera: Conjunctivae normal.  Neck:     Comments: No neck swelling or Ludwig's angina Musculoskeletal:     Cervical back: Normal range of motion and neck supple.  Lymphadenopathy:     Cervical: No cervical adenopathy.  Skin:    General: Skin is warm and dry.  Neurological:     Mental Status: She is alert.     ED Results / Procedures / Treatments   Labs (all labs ordered are listed, but only abnormal results are displayed) Labs Reviewed - No data to display  EKG None  Radiology No results found.  Procedures Procedures    Medications Ordered in ED Medications - No data to display  ED Course/ Medical Decision Making/ A&P    Patient seen and examined.  History obtained directly from patient.   Labs/EKG: None ordered.  Imaging: None ordered.  Medications/Fluids: None ordered  Most recent vital signs reviewed and are as follows: BP (!) 149/83 (BP Location: Right Arm)   Pulse 65   Temp 97.7 F (36.5 C) (Oral)   Resp 16   Ht 5\' 2"  (1.575 m)   Wt 74.8 kg   SpO2 98%   BMI 30.18 kg/m   Initial impression: dental pain/dental infection  Plan: Discharge to home.   Prescriptions written for: Augmentin  Other home care instructions discussed: Avoidance of chewing or other activities that makes the symptoms worse. Eat soft foods if needed and maintain good hydration.   ED return instructions discussed: Encouraged patient to return with worsening facial or neck swelling, difficulty breathing or swallowing,  fever.   Follow-up instructions discussed: Patient encouraged to follow-up with provided dental referral, resources -- or their own dentist if able.                                 Medical Decision Making Risk Prescription drug management.   Patient presents for dental pain. They do not have a fever and do not appear septic. Exam unconcerning for Ludwig's angina or other deep tissue infection in neck and I do not feel that advanced imaging is indicated at this time. Low suspicion for PTA, RPA, epiglottis based on exam.   Patient will be treated for dental infection with antibiotic. Encouraged tylenol/NSAIDs as prescribed or as directed on the packaging for pain. Encouraged follow-up with a dentist for definitive and long-term management.         Final Clinical Impression(s) / ED Diagnoses Final diagnoses:  Dental infection    Rx / DC Orders ED Discharge Orders          Ordered    amoxicillin-clavulanate (AUGMENTIN) 875-125 MG tablet  Every 12 hours        11/18/22 1154              Renne Crigler, PA-C 11/18/22 1157    Benjiman Core, MD 11/18/22 1236

## 2022-11-29 ENCOUNTER — Other Ambulatory Visit (HOSPITAL_COMMUNITY): Payer: Self-pay

## 2022-12-20 ENCOUNTER — Other Ambulatory Visit (HOSPITAL_COMMUNITY): Payer: Self-pay

## 2022-12-27 IMAGING — MG DIGITAL SCREENING BILAT W/ CAD
4 series · 4 of 4 positions shown · non-contrast
Comparison: Previous exam(s).

CLINICAL DATA: Screening.

EXAM:
DIGITAL SCREENING BILATERAL MAMMOGRAM WITH CAD
TECHNIQUE: Bilateral screening digital craniocaudal and mediolateral oblique
mammograms were obtained. The images were evaluated with
computer-aided detection.

[L MLO]
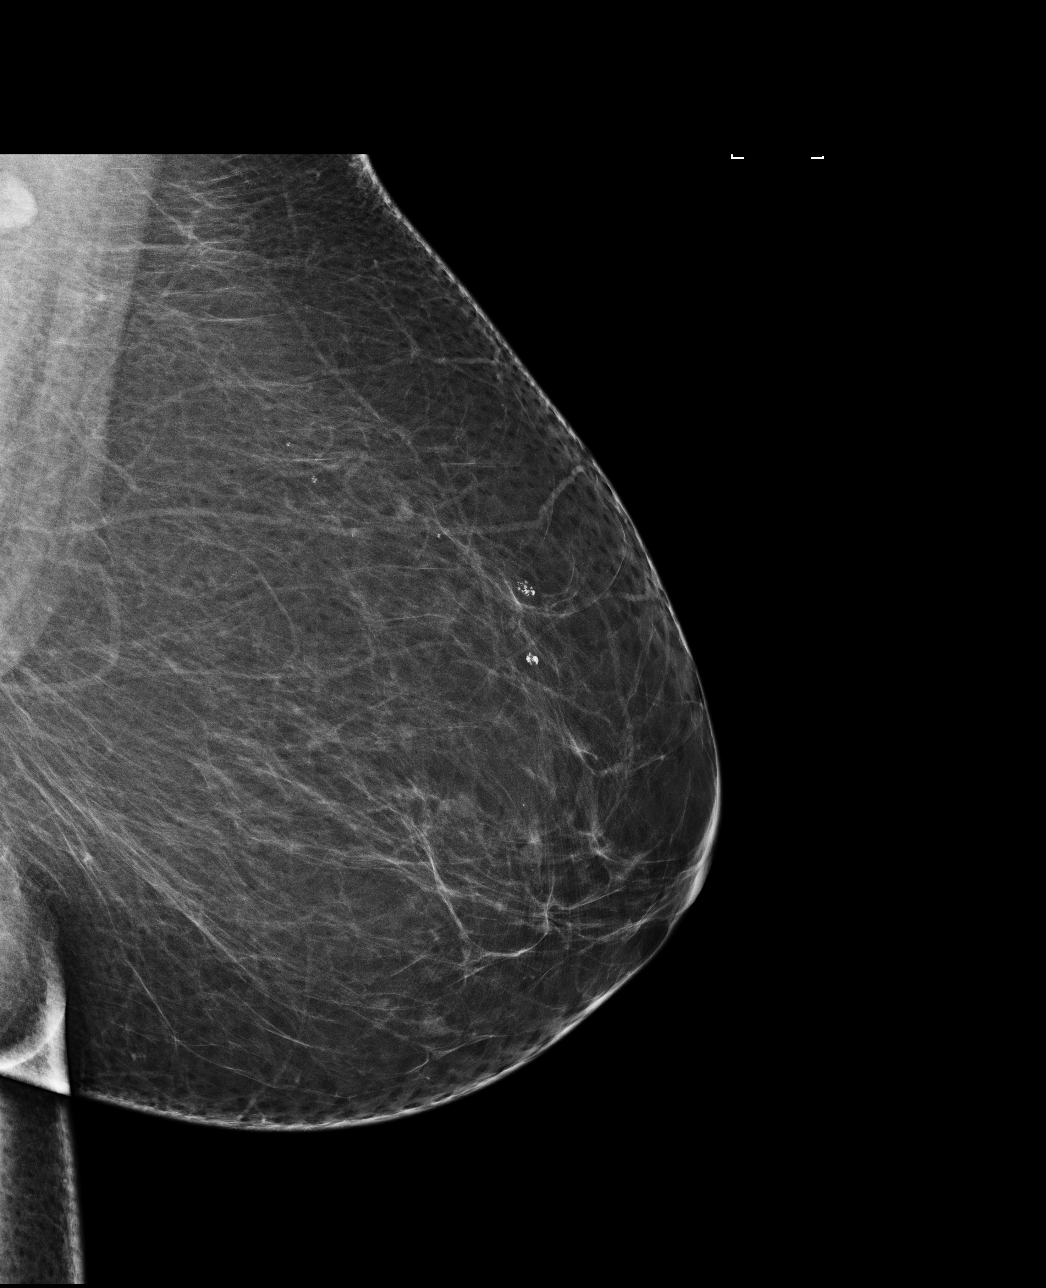

[R MLO]
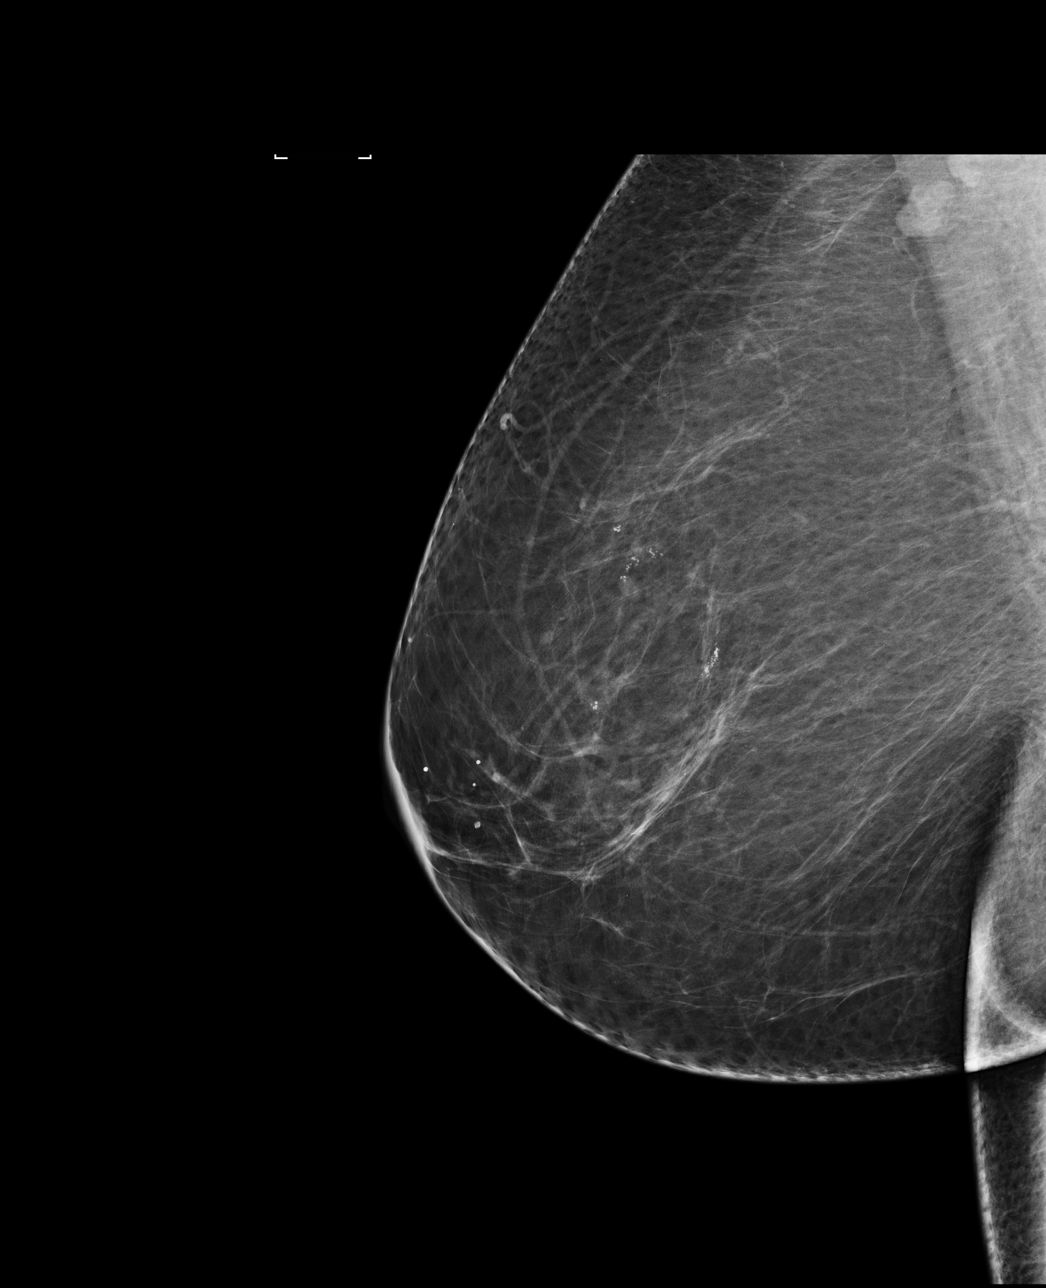

[R CC]
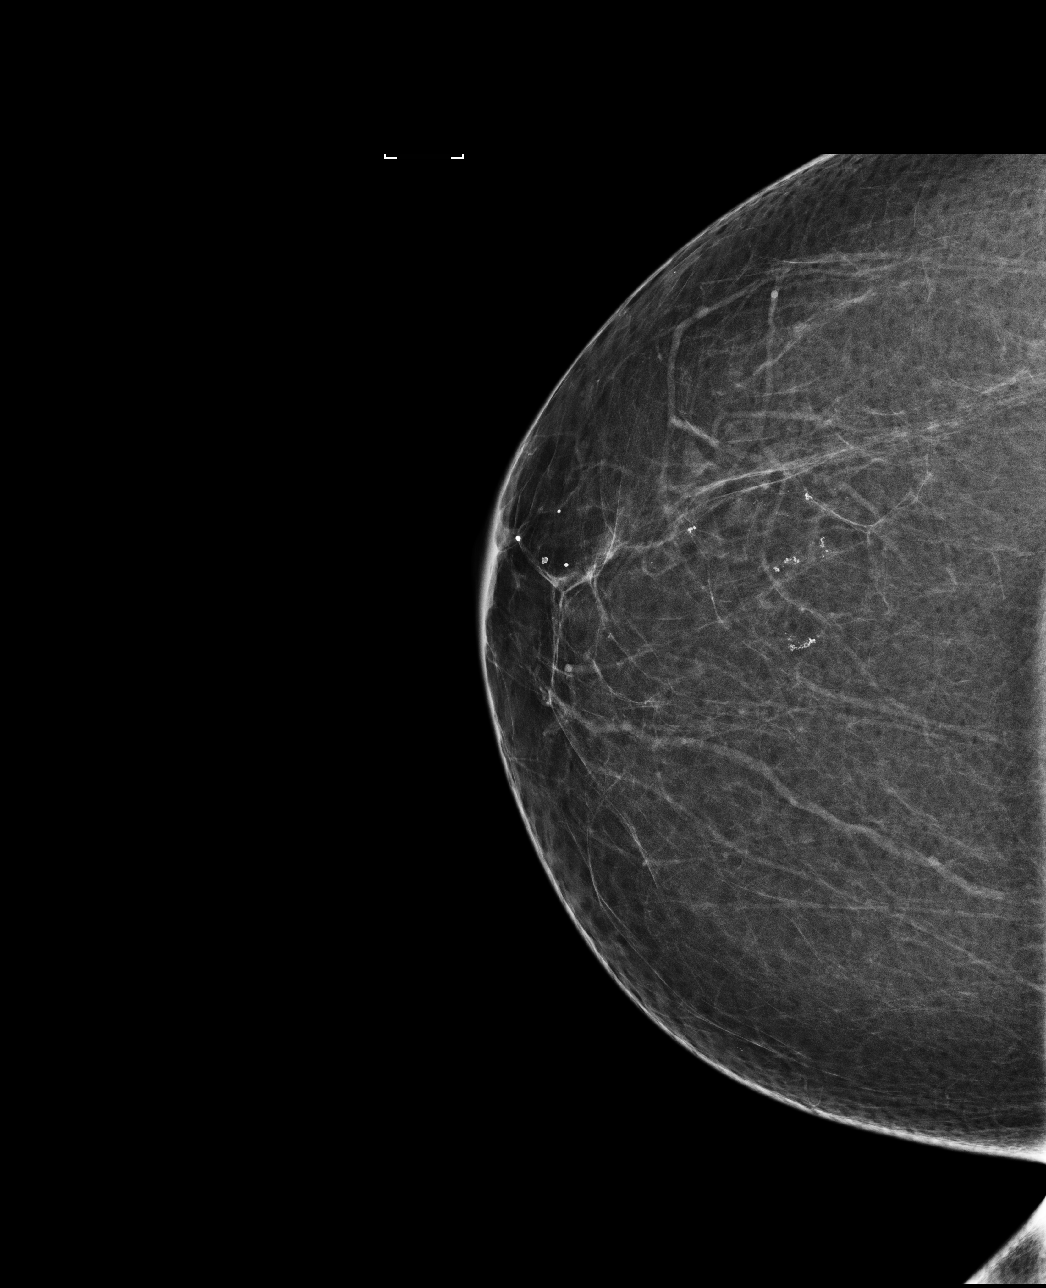

[L CC]
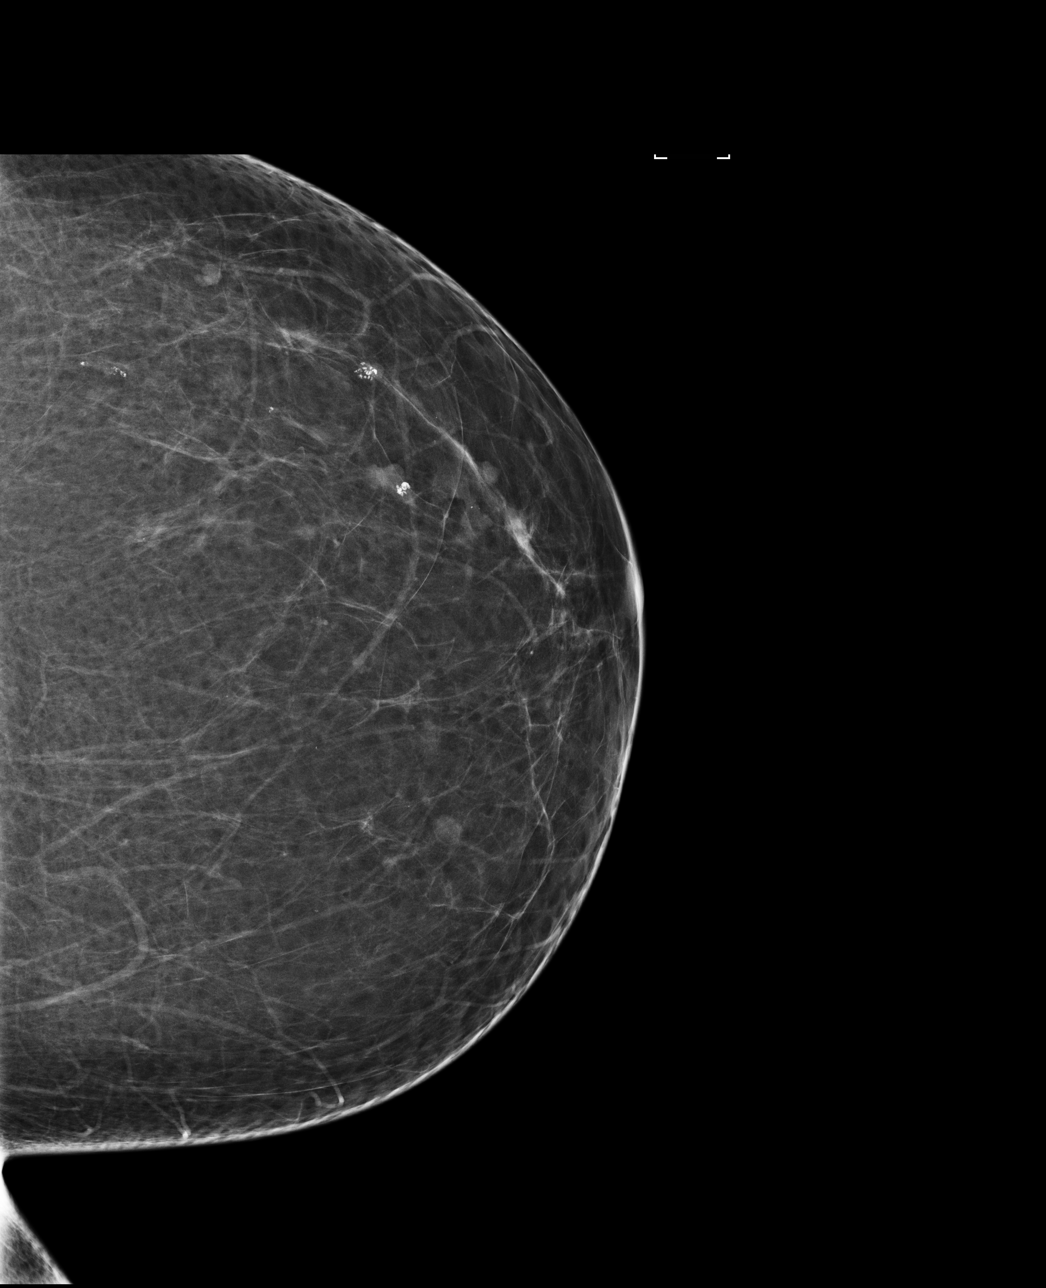

[4 of 4 positions shown; findings below may reference images not displayed]

ACR Breast Density Category b: There are scattered areas of
fibroglandular density.
FINDINGS: There are no findings suspicious for malignancy.
IMPRESSION: No mammographic evidence of malignancy. A result letter of this
screening mammogram will be mailed directly to the patient.

RECOMMENDATION:
Screening mammogram in one year. (Code:WO-V-ZRK)

BI-RADS CATEGORY  1: Negative.

## 2023-01-05 ENCOUNTER — Encounter (HOSPITAL_BASED_OUTPATIENT_CLINIC_OR_DEPARTMENT_OTHER): Payer: Self-pay | Admitting: Emergency Medicine

## 2023-01-05 ENCOUNTER — Other Ambulatory Visit (HOSPITAL_BASED_OUTPATIENT_CLINIC_OR_DEPARTMENT_OTHER): Payer: Self-pay

## 2023-01-05 ENCOUNTER — Other Ambulatory Visit: Payer: Self-pay

## 2023-01-05 ENCOUNTER — Emergency Department (HOSPITAL_BASED_OUTPATIENT_CLINIC_OR_DEPARTMENT_OTHER)
Admission: EM | Admit: 2023-01-05 | Discharge: 2023-01-05 | Disposition: A | Discharge status: Home / Self Care | Payer: Medicare HMO | Attending: Emergency Medicine | Admitting: Emergency Medicine

## 2023-01-05 ENCOUNTER — Emergency Department (HOSPITAL_BASED_OUTPATIENT_CLINIC_OR_DEPARTMENT_OTHER): Payer: Medicare HMO

## 2023-01-05 DIAGNOSIS — I110 Hypertensive heart disease with heart failure: Secondary | ICD-10-CM | POA: Insufficient documentation

## 2023-01-05 DIAGNOSIS — R42 Dizziness and giddiness: Secondary | ICD-10-CM | POA: Insufficient documentation

## 2023-01-05 DIAGNOSIS — I509 Heart failure, unspecified: Secondary | ICD-10-CM | POA: Insufficient documentation

## 2023-01-05 DIAGNOSIS — Z79899 Other long term (current) drug therapy: Secondary | ICD-10-CM | POA: Diagnosis not present

## 2023-01-05 DIAGNOSIS — Z7982 Long term (current) use of aspirin: Secondary | ICD-10-CM | POA: Insufficient documentation

## 2023-01-05 LAB — CBC WITH DIFFERENTIAL/PLATELET
Abs Immature Granulocytes: 0.01 K/uL (ref 0.00–0.07)
Basophils Absolute: 0 K/uL (ref 0.0–0.1)
Basophils Relative: 0 %
Eosinophils Absolute: 0.2 K/uL (ref 0.0–0.5)
Eosinophils Relative: 2 %
HCT: 40.2 % (ref 36.0–46.0)
Hemoglobin: 13.3 g/dL (ref 12.0–15.0)
Immature Granulocytes: 0 %
Lymphocytes Relative: 25 %
Lymphs Abs: 2.1 K/uL (ref 0.7–4.0)
MCH: 29 pg (ref 26.0–34.0)
MCHC: 33.1 g/dL (ref 30.0–36.0)
MCV: 87.8 fL (ref 80.0–100.0)
Monocytes Absolute: 0.7 K/uL (ref 0.1–1.0)
Monocytes Relative: 8 %
Neutro Abs: 5.3 K/uL (ref 1.7–7.7)
Neutrophils Relative %: 65 %
Platelets: 288 K/uL (ref 150–400)
RBC: 4.58 MIL/uL (ref 3.87–5.11)
RDW: 12.4 % (ref 11.5–15.5)
WBC: 8.3 K/uL (ref 4.0–10.5)
nRBC: 0 % (ref 0.0–0.2)

## 2023-01-05 LAB — BASIC METABOLIC PANEL WITH GFR
Anion gap: 5 (ref 5–15)
BUN: 16 mg/dL (ref 8–23)
CO2: 29 mmol/L (ref 22–32)
Calcium: 9.9 mg/dL (ref 8.9–10.3)
Chloride: 104 mmol/L (ref 98–111)
Creatinine, Ser: 0.83 mg/dL (ref 0.44–1.00)
GFR, Estimated: >60 mL/min
Glucose, Bld: 102 mg/dL — ABNORMAL HIGH (ref 70–99)
Potassium: 4.5 mmol/L (ref 3.5–5.1)
Sodium: 138 mmol/L (ref 135–145)

## 2023-01-05 MED ORDER — LORAZEPAM 1 MG PO TABS
1.0000 mg | ORAL_TABLET | Freq: Once | ORAL | Status: AC
  Administered 2023-01-05: 1 mg via ORAL
  Filled 2023-01-05: qty 1

## 2023-01-05 MED ORDER — MECLIZINE HCL 25 MG PO TABS
25.0000 mg | ORAL_TABLET | Freq: Once | ORAL | Status: AC
  Administered 2023-01-05: 25 mg via ORAL
  Filled 2023-01-05: qty 1

## 2023-01-05 MED ORDER — MECLIZINE HCL 25 MG PO TABS
25.0000 mg | ORAL_TABLET | Freq: Three times a day (TID) | ORAL | 0 refills | Status: DC | PRN
  Filled 2023-01-05: qty 30, 10d supply, fill #0

## 2023-01-05 NOTE — ED Provider Notes (Signed)
[place] EMERGENCY DEPARTMENT AT [facility] Provider Note  CSN: [id number] Arrival date & time: 01/05/23 1121  Chief Complaint(s) Dizziness  HPI [name] is a 67 y.o. female here today for persistent dizziness.  Patient says symptoms been ongoing for "3 to 4 days."  She says that nothing seems to make it better or worse.  It happens when she is sitting still.  She has had issues with vertigo in the past.  She says that this feels a little bit different.  She has not had any recent illnesses.   Past Medical History Past Medical History:  Diagnosis Date   CHF (congestive heart failure) (HCC)    Chronic diarrhea    Hypertension    Patient Active Problem List   Diagnosis Date Noted   Estrogen deficiency 10/04/2022   Dyspnea on exertion 10/04/2022   H/O heart failure 08/25/2021   History of anemia 08/11/2020   Colon cancer screening 08/11/2020   Prediabetes 04/10/2019   Healthcare maintenance 04/09/2019   Paresthesias 04/09/2019   Obese 05/06/2018   Hyperlipidemia 07/03/2017   Essential hypertension, benign 05/24/2012   Allergic sinusitis 05/24/2012   Home Medication(s) Prior to Admission medications   Medication Sig Start Date End Date Taking? Authorizing Provider  amoxicillin-clavulanate (AUGMENTIN) 875-125 MG tablet Take 1 tablet by mouth every 12 (twelve) hours. 11/18/22   Renne Crigler, PA-C  aspirin EC 81 MG tablet Take 1 tablet (81 mg total) by mouth daily. Swallow whole. 10/04/22   Reymundo Poll, MD  atorvastatin (LIPITOR) 20 MG tablet Take 1 tablet (20 mg total) by mouth daily. 10/04/22   Reymundo Poll, MD  cetirizine (ZYRTEC) 10 MG tablet Take 1 tablet (10 mg total) by mouth at bedtime. For allergies 08/11/20   Reymundo Poll, MD  lisinopril-hydrochlorothiazide (ZESTORETIC) 20-12.5 MG tablet Take 1 tablet by mouth daily. 10/04/22   Reymundo Poll, MD                                                                                                                                     Past Surgical History Past Surgical History:  Procedure Laterality Date   ABDOMINAL HYSTERECTOMY     ABDOMINAL HYSTERECTOMY W/ PARTIAL VAGINACTOMY  2000   for fibroids   CARDIAC CATHETERIZATION  2010   Family History Family History  Problem Relation Age of Onset   Heart attack Brother 22   Heart disease Father 41       cath in his 32s.    Diabetes Father    Diabetes Brother    Breast cancer Mother 10    Social History Social History   Tobacco Use   Smoking status: Never   Smokeless tobacco: Never  Vaping Use   Vaping status: Never Used  Substance Use Topics   Alcohol use: No    Alcohol/week: 0.0 standard drinks of alcohol   Drug use: No   Allergies Patient has no known  allergies.  Review of Systems Review of Systems  Physical Exam Vital Signs  I have reviewed the triage vital signs BP 127/77   Pulse 60   Temp 97.8 F (36.6 C) (Oral)   Resp 12   SpO2 97%   Physical Exam Vitals and nursing note reviewed.  HENT:     Head: Normocephalic and atraumatic.     Right Ear: Tympanic membrane normal.     Left Ear: Tympanic membrane normal.  Cardiovascular:     Rate and Rhythm: Normal rate.  Pulmonary:     Effort: Pulmonary effort is normal.  Abdominal:     General: Abdomen is flat.     Palpations: Abdomen is soft.  Musculoskeletal:        General: Normal range of motion.  Skin:    General: Skin is warm and dry.  Neurological:     Mental Status: She is alert.     Gait: Gait normal.     Comments: No nystagmus     ED Results and Treatments Labs (all labs ordered are listed, but only abnormal results are displayed) Labs Reviewed  BASIC METABOLIC PANEL - Abnormal; Notable for the following components:      Result Value   Glucose, Bld 102 (*)    All other components within normal limits  CBC WITH DIFFERENTIAL/PLATELET                                                                                                                           Radiology No results found.  Pertinent labs & imaging results that were available during my care of the patient were reviewed by me and considered in my medical decision making (see MDM for details).  Medications Ordered in ED Medications  LORazepam (ATIVAN) tablet 1 mg (1 mg Oral Given 01/05/23 1234)                                                                                                                                     Procedures Procedures  (including critical care time)  Medical Decision Making / ED Course   This patient presents to the ED for concern of dizziness, this involves an extensive number of treatment options, and is a complaint that carries with it a high risk of complications and morbidity.  The differential diagnosis includes posterior CVA, peripheral vertigo, labyrinthitis, vestibular neuritis.  MDM: 67 year old female  here today with dizziness.  It is not provoked.  It is persistent.  Patient does not have any nystagmus on my exam.  Have ordered an MRI to rule out posterior CVA.  CBC, BMP ordered on the patient.   Additional history obtained:  -External records from outside source obtained and reviewed including: Chart review including previous notes, labs, imaging, consultation notes   Lab Tests: -I ordered, reviewed, and interpreted labs.   The pertinent results include: No anemia Labs Reviewed  BASIC METABOLIC PANEL - Abnormal; Notable for the following components:      Result Value   Glucose, Bld 102 (*)    All other components within normal limits  CBC WITH DIFFERENTIAL/PLATELET      EKG normal sinus rhythm, no evidence of acute ischemia  EKG Interpretation Date/Time:    Ventricular Rate:    PR Interval:    QRS Duration:    QT Interval:    QTC Calculation:   R Axis:      Text Interpretation:           Imaging Studies ordered: I ordered imaging studies including MRI I independently visualized and  interpreted imaging. I agree with the radiologist interpretation   Medicines ordered and prescription drug management: Meds ordered this encounter  Medications   LORazepam (ATIVAN) tablet 1 mg    -I have reviewed the patients home medicines and have made adjustments as needed  Cardiac Monitoring: The patient was maintained on a cardiac monitor.  I personally viewed and interpreted the cardiac monitored which showed an underlying rhythm of: Normal sinus rhythm   Reevaluation: After the interventions noted above, I reevaluated the patient and found that they have :improved  Co morbidities that complicate the patient evaluation  Past Medical History:  Diagnosis Date   CHF (congestive heart failure) (HCC)    Chronic diarrhea    Hypertension       Dispostion: Out to Dr. Charm Barges pending MRI     Final Clinical Impression(s) / ED Diagnoses Final diagnoses:  None     @PCDICTATION @    Anders Simmonds T, DO 01/05/23 1443

## 2023-01-05 NOTE — ED Notes (Signed)
Pt transported to MRI via wheelchair.

## 2023-01-05 NOTE — ED Triage Notes (Signed)
Pt reports dizziness since Monday and admits to sporadic use of her BP medication.  Pt not taking any blood thinners, denies headache, n/v or ear pain at present.

## 2023-01-05 NOTE — ED Provider Notes (Signed)
Signout from Dr. Andria Meuse.  67 year old female here with dizziness for the last 3 to 4 days.  History of vertigo.  MRI pending. Physical Exam  BP 127/77   Pulse 60   Temp 97.8 F (36.6 C)   Resp 12   SpO2 97%   Physical Exam  Procedures  Procedures  ED Course / MDM    Medical Decision Making Amount and/or Complexity of Data Reviewed Labs: ordered. Radiology: ordered.  Risk Prescription drug management.   MRI does not show any evidence of acute stroke.  Will review with patient. Patient states she is feeling somewhat improved and is comfortable plan for discharge.  Will provide her prescription for meclizine as it seems to have helped her a little bit in the department.  Return instructions discussed     Terrilee Files, MD 01/06/23 [phone]

## 2023-01-05 NOTE — ED Notes (Signed)
Pt given discharge instructions and reviewed prescriptions. Opportunities given for questions. Pt verbalizes understanding. Madi Bonfiglio R, RN 

## 2023-01-23 ENCOUNTER — Ambulatory Visit (INDEPENDENT_AMBULATORY_CARE_PROVIDER_SITE_OTHER): Payer: Medicare HMO | Admitting: Student

## 2023-01-23 ENCOUNTER — Other Ambulatory Visit (HOSPITAL_COMMUNITY): Payer: Self-pay

## 2023-01-23 VITALS — BP 126/70 | HR 66 | Temp 97.8°F | Ht 62.0 in | Wt 171.5 lb

## 2023-01-23 DIAGNOSIS — I10 Essential (primary) hypertension: Secondary | ICD-10-CM | POA: Diagnosis not present

## 2023-01-23 DIAGNOSIS — Z23 Encounter for immunization: Secondary | ICD-10-CM

## 2023-01-23 DIAGNOSIS — Z8679 Personal history of other diseases of the circulatory system: Secondary | ICD-10-CM

## 2023-01-23 DIAGNOSIS — H938X3 Other specified disorders of ear, bilateral: Secondary | ICD-10-CM

## 2023-01-23 DIAGNOSIS — J309 Allergic rhinitis, unspecified: Secondary | ICD-10-CM

## 2023-01-23 MED ORDER — FLUTICASONE PROPIONATE 50 MCG/ACT NA SUSP
1.0000 | Freq: Every day | NASAL | 11 refills | Status: AC
  Filled 2023-01-23: qty 16, 60d supply, fill #0

## 2023-01-23 MED ORDER — CETIRIZINE HCL 10 MG PO TABS
10.0000 mg | ORAL_TABLET | Freq: Every day | ORAL | 0 refills | Status: DC
  Filled 2023-01-23: qty 30, 30d supply, fill #0

## 2023-01-23 NOTE — Progress Notes (Signed)
CC: Bilateral ear fullness  HPI: [name] is a 67 y.o. female living with a history stated below and presents today for bilateral ear fullness. Please see problem based assessment and plan for additional details.  Past Medical History:  Diagnosis Date   CHF (congestive heart failure) (HCC)    Chronic diarrhea    Hypertension     Current Outpatient Medications on File Prior to Visit  Medication Sig Dispense Refill   amoxicillin-clavulanate (AUGMENTIN) 875-125 MG tablet Take 1 tablet by mouth every 12 (twelve) hours. 14 tablet 0   aspirin EC 81 MG tablet Take 1 tablet (81 mg total) by mouth daily. Swallow whole. 30 tablet 12   atorvastatin (LIPITOR) 20 MG tablet Take 1 tablet (20 mg total) by mouth daily. 90 tablet 3   cetirizine (ZYRTEC) 10 MG tablet Take 1 tablet (10 mg total) by mouth at bedtime. For allergies 90 tablet 3   lisinopril-hydrochlorothiazide (ZESTORETIC) 20-12.5 MG tablet Take 1 tablet by mouth daily. 30 tablet 11   meclizine (ANTIVERT) 25 MG tablet Take 1 tablet (25 mg total) by mouth 3 (three) times daily as needed for dizziness. 30 tablet 0   No current facility-administered medications on file prior to visit.    Family History  Problem Relation Age of Onset   Heart attack Brother 94   Heart disease Father 38       cath in his 90s.    Diabetes Father    Diabetes Brother    Breast cancer Mother 36    Social History   Socioeconomic History   Marital status: Married    Spouse name: Not on file   Number of children: Not on file   Years of education: Not on file   Highest education level: Not on file  Occupational History   Not on file  Tobacco Use   Smoking status: Never   Smokeless tobacco: Never  Vaping Use   Vaping status: Never Used  Substance and Sexual Activity   Alcohol use: No    Alcohol/week: 0.0 standard drinks of alcohol   Drug use: No   Sexual activity: Yes  Other Topics Concern   Not on file  Social History Narrative    Married and lives with husband in [place].  2 boys and 1 girl.  Former Associate Professor.  High school graduate with some college but no degree (for education).     Social Determinants of Health   Financial Resource Strain: Low Risk  (10/04/2022)   Overall Financial Resource Strain (CARDIA)    Difficulty of Paying Living Expenses: Not hard at all  Food Insecurity: No Food Insecurity (10/04/2022)   Hunger Vital Sign    Worried About Running Out of Food in the Last Year: Never true    Ran Out of Food in the Last Year: Never true  Transportation Needs: No Transportation Needs (10/04/2022)   PRAPARE - Administrator, Civil Service (Medical): No    Lack of Transportation (Non-Medical): No  Physical Activity: Inactive (10/04/2022)   Exercise Vital Sign    Days of Exercise per Week: 0 days    Minutes of Exercise per Session: 0 min  Stress: No Stress Concern Present (10/04/2022)   [employer] of Occupational Health - Occupational Stress Questionnaire    Feeling of Stress : Only a little  Social Connections: Moderately Isolated (10/04/2022)   Social Connection and Isolation Panel [NHANES]    Frequency of Communication with Friends and Family: More than  three times a week    Frequency of Social Gatherings with Friends and Family: Once a week    Attends Religious Services: More than 4 times per year    Active Member of [employer] or Organizations: No    Attends Banker Meetings: Never    Marital Status: Widowed  Intimate Partner Violence: Not At Risk (10/04/2022)   Humiliation, Afraid, Rape, and Kick questionnaire    Fear of Current or Ex-Partner: No    Emotionally Abused: No    Physically Abused: No    Sexually Abused: No    Review of Systems: ROS negative except for what is noted on the assessment and plan.  Vitals:   01/23/23 1552  BP: 126/70  Pulse: 66  Temp: 97.8 F (36.6 C)  TempSrc: Oral  SpO2: 100%  Weight: 171 lb 8 oz (77.8 kg)  Height: 5\' 2"  (1.575 m)     Physical Exam: Constitutional: well-appearing woman, sitting in chair, in no acute distress HENT: Patent ear canal bilaterally, no drainage or discharge Cardiovascular: regular rate and rhythm,  Pulmonary/Chest: lungs clear to auscultation bilaterally Neurological: alert & oriented x 3, no focal deficit Psych: normal mood and behavior  Assessment & Plan:   No problem-specific Assessment & Plan notes found for this encounter.  Bilateral ear fullness Patient presented with concerns for bilateral ear fullness, for the past 3 days, denies any ringing or discharge during this time.  Of note, patient went to the ED due to concerns of dizziness, was given meclizine, reports dizziness has resolved.  MRI brain done during that visit did not show any brain masses or any intracranial pathology that was concerning. She was seen for similar presentation in 2022 by Dr. Antony Contras.  Patient has a history of allergies and based on my exam and the description of his symptoms ,I suspect her allergies is causing her ear fullness at this time. Will prescribe fluticasone spray and Zyrtec for now.  Patient advised to call the clinic if symptoms get worse or there are anything concerning.  Hypertension Heart Failure  With a history of hypertension currently on lisinopril.  Blood pressure in office today is at goal of 126/70 .  Patient also has a history of heart failure with preserved ejection fraction.  TTE done in 2024 showed ejection fraction of 50% .  Repeat echo insulin 24 showed an ejection fraction of 55% with no regional wall abnormalities. -Continuing lisinopril-hydrochlorothiazide 20-12.5 mg tablets  Hyperlipidemia Lipid panel 3 months ago unremarkable - Continue atorvastatin 20 mg - Repeat lipid panel at next visit  Encounter for immunization Patient has not taking her flu shot and is agreeable to checking that in the office today. -Flu shot  Patient seen with Dr. Heloise Beecham,  M.D [facility] Phone: [phone] Date 01/23/2023 Time 4:54 PM

## 2023-01-23 NOTE — Patient Instructions (Addendum)
Thank you, Ms.[name] for allowing [place] to provide your care today.   Today we discussed your general health .   You had concerns for ear fullness that we think is probably from your  allergies. I am sending you home to try zyrtec and fluticasone  nasal spray. Please try these and let us know how you do. These medications are cheaper at the [facility] but you can certainly get them from any pharmacy We also gave you the flu shot today.    I have ordered the following labs for you:  Lab Orders  No laboratory test(s) ordered today    Referral Orders  No referral(s) requested today     I have ordered the following medication/changed the following medications:   Stop the following medications: There are no discontinued medications.   Start the following medications: Meds ordered this encounter  Medications   fluticasone (FLONASE) 50 MCG/ACT nasal spray    Sig: Place 1 spray into both nostrils daily.    Dispense:  16 g    Refill:  11   cetirizine (ZYRTEC ALLERGY) 10 MG tablet    Sig: Take 1 tablet (10 mg total) by mouth daily.    Dispense:  30 tablet    Refill:  0     Should you have any questions or concerns please call the internal medicine clinic at [phone].    Kathleen Lime, M.D [facility]

## 2023-01-24 NOTE — Addendum Note (Signed)
Addended by: Derrek Monaco on: 01/24/2023 10:41 AM   Modules accepted: Level of Service

## 2023-01-24 NOTE — Progress Notes (Signed)
Internal Medicine Clinic Attending  I was physically present during the key portions of the resident provided service and participated in the medical decision making of patient's management care. I reviewed pertinent patient test results.  The assessment, diagnosis, and plan were formulated together and I agree with the documentation in the resident's note.  Mercie Eon, MD    On my exam, I can clearly visualize her TM's. No erythema or drainage. She's having sinus congestion & runny nose, which is typical for her this time of year. I agree that her allergies are likely the driver of her ear fullness, so agree with Zyrtec and Flonase to treat.

## 2023-02-05 ENCOUNTER — Other Ambulatory Visit (HOSPITAL_COMMUNITY): Payer: Self-pay

## 2023-02-15 ENCOUNTER — Other Ambulatory Visit: Payer: Self-pay

## 2023-02-15 ENCOUNTER — Ambulatory Visit: Payer: Medicare HMO | Admitting: Student

## 2023-02-15 VITALS — BP 105/58 | HR 60 | Temp 98.3°F | Ht 62.0 in | Wt 171.2 lb

## 2023-02-15 DIAGNOSIS — H938X3 Other specified disorders of ear, bilateral: Secondary | ICD-10-CM | POA: Diagnosis not present

## 2023-02-15 NOTE — Progress Notes (Signed)
NG:[id number] on bilateral ear fullness  HPI:  [name] is a 67 y.o. female living with a history stated below and presents today for bilateral ear fullness follow-up. Please see problem based assessment and plan for additional details.  Past Medical History:  Diagnosis Date   CHF (congestive heart failure) (HCC)    Chronic diarrhea    Hypertension     Current Outpatient Medications on File Prior to Visit  Medication Sig Dispense Refill   amoxicillin-clavulanate (AUGMENTIN) 875-125 MG tablet Take 1 tablet by mouth every 12 (twelve) hours. 14 tablet 0   aspirin EC 81 MG tablet Take 1 tablet (81 mg total) by mouth daily. Swallow whole. 30 tablet 12   atorvastatin (LIPITOR) 20 MG tablet Take 1 tablet (20 mg total) by mouth daily. 90 tablet 3   cetirizine (ZYRTEC ALLERGY) 10 MG tablet Take 1 tablet (10 mg total) by mouth daily. 30 tablet 0   cetirizine (ZYRTEC) 10 MG tablet Take 1 tablet (10 mg total) by mouth at bedtime. For allergies 90 tablet 3   fluticasone (FLONASE) 50 MCG/ACT nasal spray Place 1 spray into both nostrils daily. 16 g 11   lisinopril-hydrochlorothiazide (ZESTORETIC) 20-12.5 MG tablet Take 1 tablet by mouth daily. 30 tablet 11   meclizine (ANTIVERT) 25 MG tablet Take 1 tablet (25 mg total) by mouth 3 (three) times daily as needed for dizziness. 30 tablet 0   No current facility-administered medications on file prior to visit.    Family History  Problem Relation Age of Onset   Heart attack Brother 87   Heart disease Father 68       cath in his 38s.    Diabetes Father    Diabetes Brother    Breast cancer Mother 32    Social History   Socioeconomic History   Marital status: Married    Spouse name: Not on file   Number of children: Not on file   Years of education: Not on file   Highest education level: Not on file  Occupational History   Not on file  Tobacco Use   Smoking status: Never   Smokeless tobacco: Never  Vaping Use   Vaping status:  Never Used  Substance and Sexual Activity   Alcohol use: No    Alcohol/week: 0.0 standard drinks of alcohol   Drug use: No   Sexual activity: Yes  Other Topics Concern   Not on file  Social History Narrative   Married and lives with husband in [name].  2 boys and 1 girl.  Former Associate Professor.  High school graduate with some college but no degree (for education).     Social Drivers of Corporate investment banker Strain: Low Risk  (10/04/2022)   Overall Financial Resource Strain (CARDIA)    Difficulty of Paying Living Expenses: Not hard at all  Food Insecurity: No Food Insecurity (10/04/2022)   Hunger Vital Sign    Worried About Running Out of Food in the Last Year: Never true    Ran Out of Food in the Last Year: Never true  Transportation Needs: No Transportation Needs (10/04/2022)   PRAPARE - Administrator, Civil Service (Medical): No    Lack of Transportation (Non-Medical): No  Physical Activity: Inactive (10/04/2022)   Exercise Vital Sign    Days of Exercise per Week: 0 days    Minutes of Exercise per Session: 0 min  Stress: No Stress Concern Present (10/04/2022)   [employer] of Occupational  Health - Occupational Stress Questionnaire    Feeling of Stress : Only a little  Social Connections: Moderately Isolated (10/04/2022)   Social Connection and Isolation Panel [NHANES]    Frequency of Communication with Friends and Family: More than three times a week    Frequency of Social Gatherings with Friends and Family: Once a week    Attends Religious Services: More than 4 times per year    Active Member of [employer] or Organizations: No    Attends Banker Meetings: Never    Marital Status: Widowed  Intimate Partner Violence: Not At Risk (10/04/2022)   Humiliation, Afraid, Rape, and Kick questionnaire    Fear of Current or Ex-Partner: No    Emotionally Abused: No    Physically Abused: No    Sexually Abused: No    Review of Systems: ROS negative  except for what is noted on the assessment and plan.  Vitals:   02/15/23 1555  BP: (!) 105/58  Pulse: 60  Temp: 98.3 F (36.8 C)  TempSrc: Oral  SpO2: 96%  Weight: 171 lb 3.2 oz (77.7 kg)  Height: 5\' 2"  (1.575 m)    Physical Exam: Constitutional: well-appearing woman, sitting in chair , in no acute distress HENT:  Did not appreciate any cerumen in the ear, canal remains patent.  Whisper test is negative.  There is no drainage or discharge from the ear on inspection.normocephalic atraumatic, mucous membranes moist Cardiovascular: regular rate and rhythm, no m/r/g Pulmonary/Chest: normal work of breathing on room air, lungs clear to auscultation bilaterally  Assessment & Plan:   Ear fullness, bilateral  This is a 67 year old female with controlled hypertension and history of hyperlipidemia on Lipitor 20 mg ,presenting to the office due to concerns for bilateral ear fullness.  Of note ,she presented to [place] a couple of weeks ago with the same presentation and concerns and was advised to use Flonase and other conservative management.  Patient reports in the office today that her bilateral ear fullness has not improved.  Ear exam remains unremarkable, did not appreciate any cerumen in the ear canal remains patent.  Whisper test is negative.  There is no drainage or discharge from the ear on inspection.  Patient will benefit from ENT evaluation at this time. -Ambulatory referral to ENT   Patient discussed with Dr. Lavonna Monarch, M.D [facility] Phone: [phone] Date 02/15/2023 Time 4:37 PM

## 2023-02-15 NOTE — Patient Instructions (Addendum)
Thank you, [name] for allowing [place] to provide your care today. Today we discussed your general health and your hearing changes. -Because we have tried other measures and your hearing changes has not improved at this time, refer you to an ENT specialist for further evaluation.  The ENT office will be calling you in the next few couple of days to schedule that appointment.    I have ordered the following labs for you:  Lab Orders  No laboratory test(s) ordered today     Tests ordered today:    Referrals ordered today:   Referral Orders         Ambulatory referral to ENT       I have ordered the following medication/changed the following medications:   Stop the following medications: There are no discontinued medications.   Start the following medications: No orders of the defined types were placed in this encounter.    Follow up: 6 months   Remember:   Should you have any questions or concerns please call the internal medicine clinic at [phone].    Kathleen Lime, M.D [facility]

## 2023-02-15 NOTE — Assessment & Plan Note (Signed)
This is a 67 year old female with controlled hypertension and history of hyperlipidemia on Lipitor 20 mg ,presenting to the office due to concerns for bilateral ear fullness.  Of note ,she presented to [place] a couple of weeks ago with the same presentation and concerns and was advised to use Flonase and other conservative management.  Patient reports in the office today that her bilateral ear fullness has not improved.  Ear exam remains unremarkable, did not appreciate any cerumen in the ear canal remains patent.  Whisper test is negative.  There is no drainage or discharge from the ear on inspection.  Patient will benefit from ENT evaluation at this time. -Ambulatory referral to ENT

## 2023-03-02 NOTE — Progress Notes (Addendum)
Internal Medicine Clinic Attending  Internal Medicine Clinic Attending  Case discussed with the resident at the time of the visit.  We reviewed the resident's history and exam and pertinent patient test results.  I agree with the assessment, diagnosis, and plan of care documented in the resident's note.   Eustachian tube dysfunction high on differential.  Miguel Aschoff, MD

## 2023-03-12 ENCOUNTER — Other Ambulatory Visit (HOSPITAL_COMMUNITY): Payer: Self-pay

## 2023-03-30 ENCOUNTER — Telehealth (INDEPENDENT_AMBULATORY_CARE_PROVIDER_SITE_OTHER): Payer: Self-pay | Admitting: Otolaryngology

## 2023-03-30 NOTE — Telephone Encounter (Signed)
Reminder Call: Date: 04/02/2023 Status: Sch  Time: 11:30 AM [address] [place], [place] [address]  Confirmed time and location w/patient.

## 2023-04-02 ENCOUNTER — Other Ambulatory Visit (HOSPITAL_COMMUNITY): Payer: Self-pay

## 2023-04-02 ENCOUNTER — Encounter (INDEPENDENT_AMBULATORY_CARE_PROVIDER_SITE_OTHER): Payer: Self-pay

## 2023-04-02 ENCOUNTER — Ambulatory Visit (INDEPENDENT_AMBULATORY_CARE_PROVIDER_SITE_OTHER): Payer: No Typology Code available for payment source | Admitting: Audiology

## 2023-04-02 ENCOUNTER — Ambulatory Visit (INDEPENDENT_AMBULATORY_CARE_PROVIDER_SITE_OTHER): Payer: No Typology Code available for payment source | Admitting: Otolaryngology

## 2023-04-02 VITALS — BP 128/78 | HR 72 | Resp 19 | Ht 62.0 in | Wt 171.0 lb

## 2023-04-02 DIAGNOSIS — H9190 Unspecified hearing loss, unspecified ear: Secondary | ICD-10-CM

## 2023-04-02 DIAGNOSIS — H9193 Unspecified hearing loss, bilateral: Secondary | ICD-10-CM

## 2023-04-02 DIAGNOSIS — H903 Sensorineural hearing loss, bilateral: Secondary | ICD-10-CM | POA: Diagnosis not present

## 2023-04-02 DIAGNOSIS — H9313 Tinnitus, bilateral: Secondary | ICD-10-CM

## 2023-04-02 DIAGNOSIS — H938X3 Other specified disorders of ear, bilateral: Secondary | ICD-10-CM

## 2023-04-02 DIAGNOSIS — H608X3 Other otitis externa, bilateral: Secondary | ICD-10-CM

## 2023-04-02 MED ORDER — CIPROFLOXACIN-DEXAMETHASONE 0.3-0.1 % OT SUSP
4.0000 [drp] | Freq: Two times a day (BID) | OTIC | 0 refills | Status: AC
  Filled 2023-04-02: qty 7.5, 10d supply, fill #0
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
## 2023-04-02 NOTE — Patient Instructions (Addendum)
Use Ciprodex drops 4 drops twice daily x10 days in both ears Try white noise machine from [place] from the ringing

## 2023-04-02 NOTE — Progress Notes (Signed)
  [address] [place], [place] [address] [phone]  Audiological Evaluation    Name: [name]     DOB:   [birth year]      MRN:   [id number]                                                                                     Service Date: 04/02/2023     Accompanied by: unaccompanied    Patient comes today after Dr. Allena Katz, ENT sent a referral for a hearing evaluation due to concerns with tinnitus.   Symptoms Yes Details  Hearing loss  []  maybe  Tinnitus  [x]  Both ears , sometimes difficult to sleep on her side ( seems louder)  and is described as "emptiness ring tone".  Ear pain/ Ear infections/ Ear pressure  [x]  Both ears -constant since 4 months ago  Balance problems  [x]  Reports spinning episode that lasted 2-3 days  to a week  (only has happened once) and meclizine helped.  Noise exposure  []    Previous ear surgeries  []    Family history of hearing loss  []    Amplification  []    Other  []      Otoscopy: Right ear: Clear external ear canals and notable landmarks visualized on the tympanic membrane. Left ear:  Clear external ear canals and notable landmarks visualized on the tympanic membrane.  Tympanometry: Right ear: Type A- Normal external ear canal volume with normal middle ear pressure and tympanic membrane compliance. Left ear: Type A- Normal external ear canal volume with normal middle ear pressure and tympanic membrane compliance.   Pure tone Audiometry: Right ear- Normal hearing from 125-3000Hz , then mild to moderate sensorineural hearing loss from 4000 Hz - 8000 Hz. Left ear-  Normal hearing from 125-3000Hz , then mild to moderate sensorineural hearing loss from 4000 Hz - 8000 Hz.  The hearing test results were completed under headphones and results are deemed to be of good reliability. Test technique:  conventional     Speech Audiometry: Right ear- Speech Reception Threshold (SRT) was obtained at 15 dBHL Left ear-Speech Reception Threshold  (SRT) was obtained at 10 dBHL   Word Recognition Score Tested using NU-6 (MLV) Right ear: 92% was obtained at a presentation level of 50 dBHL with contralateral masking which is deemed as  excellent Left ear: 96% was obtained at a presentation level of 50 dBHL with contralateral masking which is deemed as  excellent    Impression: There is not a significant difference in pure-tone thresholds between ears. There is not a significant difference in the word recognition score in between ears.    Recommendations: Follow up with ENT, as per ENT.   Giang Hemme MARIE LEROUX-MARTINEZ, AUD

## 2023-04-13 ENCOUNTER — Other Ambulatory Visit (HOSPITAL_COMMUNITY): Payer: Self-pay

## 2023-05-14 ENCOUNTER — Telehealth (INDEPENDENT_AMBULATORY_CARE_PROVIDER_SITE_OTHER): Payer: Self-pay | Admitting: Otolaryngology

## 2023-05-14 NOTE — Telephone Encounter (Signed)
 Confirmed appt and address with patient for 05/15/2023.

## 2023-05-15 ENCOUNTER — Ambulatory Visit (INDEPENDENT_AMBULATORY_CARE_PROVIDER_SITE_OTHER): Payer: No Typology Code available for payment source | Admitting: Otolaryngology

## 2023-05-15 ENCOUNTER — Encounter (INDEPENDENT_AMBULATORY_CARE_PROVIDER_SITE_OTHER): Payer: Self-pay

## 2023-05-15 VITALS — BP 117/64 | HR 71 | Ht 62.0 in | Wt 171.0 lb

## 2023-05-15 DIAGNOSIS — H608X3 Other otitis externa, bilateral: Secondary | ICD-10-CM | POA: Diagnosis not present

## 2023-05-15 DIAGNOSIS — H938X3 Other specified disorders of ear, bilateral: Secondary | ICD-10-CM | POA: Diagnosis not present

## 2023-05-15 DIAGNOSIS — H9313 Tinnitus, bilateral: Secondary | ICD-10-CM

## 2023-05-15 DIAGNOSIS — H903 Sensorineural hearing loss, bilateral: Secondary | ICD-10-CM
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
## 2023-06-06 ENCOUNTER — Ambulatory Visit
Admission: RE | Admit: 2023-06-06 | Discharge: 2023-06-06 | Disposition: A | Discharge status: Home / Self Care | Payer: Medicare HMO | Source: Ambulatory Visit | Attending: Internal Medicine | Admitting: Internal Medicine

## 2023-06-06 DIAGNOSIS — N958 Other specified menopausal and perimenopausal disorders: Secondary | ICD-10-CM | POA: Diagnosis not present

## 2023-06-06 DIAGNOSIS — E2839 Other primary ovarian failure: Secondary | ICD-10-CM | POA: Diagnosis not present

## 2023-06-06 DIAGNOSIS — M8588 Other specified disorders of bone density and structure, other site: Secondary | ICD-10-CM | POA: Diagnosis not present

## 2023-06-07 ENCOUNTER — Telehealth: Payer: Self-pay | Admitting: Internal Medicine

## 2023-06-07 NOTE — Telephone Encounter (Addendum)
 Discussed results of bone density testing.  She will start one a day women's with vit D and calcium.  Her Fracture risk index is 3 with only 0.9% risk of fracture in the next 5 years.  Frax about 9% in 10 years.  Repeat study in next 1-2 years.

## 2023-07-02 ENCOUNTER — Other Ambulatory Visit (HOSPITAL_COMMUNITY): Payer: Self-pay

## 2023-10-31 ENCOUNTER — Other Ambulatory Visit: Payer: Self-pay

## 2023-11-01 ENCOUNTER — Other Ambulatory Visit: Payer: Self-pay | Admitting: *Deleted

## 2023-11-01 ENCOUNTER — Other Ambulatory Visit (HOSPITAL_COMMUNITY): Payer: Self-pay

## 2023-11-01 DIAGNOSIS — I10 Essential (primary) hypertension: Secondary | ICD-10-CM

## 2023-11-01 MED ORDER — LISINOPRIL-HYDROCHLOROTHIAZIDE 20-12.5 MG PO TABS
1.0000 | ORAL_TABLET | Freq: Every day | ORAL | 0 refills | Status: DC
  Filled 2023-11-01: qty 30, 30d supply, fill #0

## 2023-11-12 ENCOUNTER — Ambulatory Visit: Admitting: Student

## 2023-11-12 ENCOUNTER — Other Ambulatory Visit: Payer: Self-pay

## 2023-11-12 ENCOUNTER — Emergency Department (HOSPITAL_BASED_OUTPATIENT_CLINIC_OR_DEPARTMENT_OTHER)
Admission: EM | Admit: 2023-11-12 | Discharge: 2023-11-12 | Discharge status: Against Medical Advice | Attending: Emergency Medicine | Admitting: Emergency Medicine

## 2023-11-12 ENCOUNTER — Other Ambulatory Visit (HOSPITAL_COMMUNITY): Payer: Self-pay

## 2023-11-12 ENCOUNTER — Encounter: Payer: Self-pay | Admitting: Student

## 2023-11-12 VITALS — BP 127/52 | HR 60 | Temp 97.7°F | Ht 62.0 in | Wt 168.4 lb

## 2023-11-12 DIAGNOSIS — I10 Essential (primary) hypertension: Secondary | ICD-10-CM

## 2023-11-12 DIAGNOSIS — Z8679 Personal history of other diseases of the circulatory system: Secondary | ICD-10-CM | POA: Diagnosis not present

## 2023-11-12 DIAGNOSIS — Z203 Contact with and (suspected) exposure to rabies: Secondary | ICD-10-CM | POA: Diagnosis present

## 2023-11-12 DIAGNOSIS — R42 Dizziness and giddiness: Secondary | ICD-10-CM

## 2023-11-12 DIAGNOSIS — Z23 Encounter for immunization: Secondary | ICD-10-CM

## 2023-11-12 DIAGNOSIS — Z209 Contact with and (suspected) exposure to unspecified communicable disease: Secondary | ICD-10-CM

## 2023-11-12 DIAGNOSIS — Z5321 Procedure and treatment not carried out due to patient leaving prior to being seen by health care provider: Secondary | ICD-10-CM | POA: Insufficient documentation

## 2023-11-12 DIAGNOSIS — E785 Hyperlipidemia, unspecified: Secondary | ICD-10-CM | POA: Diagnosis not present

## 2023-11-12 DIAGNOSIS — Z1211 Encounter for screening for malignant neoplasm of colon: Secondary | ICD-10-CM

## 2023-11-12 DIAGNOSIS — Z1231 Encounter for screening mammogram for malignant neoplasm of breast: Secondary | ICD-10-CM

## 2023-11-12 MED ORDER — LISINOPRIL 10 MG PO TABS
10.0000 mg | ORAL_TABLET | Freq: Every day | ORAL | 3 refills | Status: DC
  Filled 2023-11-12: qty 30, 30d supply, fill #0

## 2023-11-12 NOTE — ED Triage Notes (Signed)
 Pt POV, bats are in house, saw one in the dining room a few weeks ago, no contact with bat. Seen PCP today and advised to come to ED for rabies shot.

## 2023-11-12 NOTE — Patient Instructions (Signed)
 Thank you, [name] for allowing us  to provide your care today. Today we discussed blood pressure, heart failure, cholesterol, feeling light headed, and the exposure to bats in your house.    For the blood pressure please stop taking your combination lisinopril -hydrochlorothiazide . START taking lisinopril  10 mg daily. CHECK your blood pressure once a day and bring the list of blood pressure readings + the blood pressure cuff to the next appointment.   It is recommend that you undergo POST rabies exposure vaccination due to having bats in your living space. Please go to the emergency department (any emergency department will work) and inform them that you had bats in your living space. They will provide the vaccines and request that you follow up for additional vaccines as they see needed.   I have ordered the following labs for you:   Lab Orders         Lipid Profile        Referrals ordered today:    Referral Orders         Ambulatory referral to Gastroenterology      I have ordered the following medication/changed the following medications:   Stop the following medications: Medications Discontinued During This Encounter  Medication Reason   meclizine  (ANTIVERT ) 25 MG tablet    cetirizine  (ZYRTEC  ALLERGY) 10 MG tablet    amoxicillin -clavulanate (AUGMENTIN ) 875-125 MG tablet    lisinopril -hydrochlorothiazide  (ZESTORETIC ) 20-12.5 MG tablet      Start the following medications: Meds ordered this encounter  Medications   lisinopril  (ZESTRIL ) 10 MG tablet    Sig: Take 1 tablet (10 mg total) by mouth daily.    Dispense:  30 tablet    Refill:  3     Follow up: one month    Remember: to check your blood pressure once a day., If your blood pressure is less than 90/60, please do not take your medicine that day and call the clinic   Should you have any questions or concerns please call the internal medicine clinic at [phone].     Please note that our late policy  has changed.  If you are more than 15 minutes late to your appointment, you may be asked to reschedule your appointment.  Dr. Kandis, D.O. [facility]

## 2023-11-12 NOTE — Assessment & Plan Note (Signed)
 Last lipid profile in 2024 showed a LDL 55.  Patient has continued Lipitor 20 mg.  Will obtain lipid profile today.

## 2023-11-12 NOTE — Assessment & Plan Note (Signed)
 Lisinopril -hydrochlorothiazide  20-12.5 mg daily Checks at home every now and then 120/79 Patient presents with a history of hypertension with a blood pressure today of 127/52. Their hypertension is well controlled on a regimen ofLisinopril-hydrochlorothiazide  20-12.5 mg daily.  Patient reports symptoms of hypotension at home including dizziness/feeling light headed the past few days.   Plan: -Discontinue Lisinopril -hydrochlorothiazide  20-12.5 mg daily due to orthostatic hypotension -Start Lisinopril  10 mg  -Patient asked to keep track of BP and bring log in with her at follow up  -BMP needed at follow up appointment

## 2023-11-12 NOTE — ED Notes (Signed)
 Pt stated she has to leave to take care of her dog, will return tomorrow

## 2023-11-12 NOTE — Progress Notes (Signed)
 Established Patient Office Visit  Subjective   Patient ID: [name], female    DOB: Sep 19, 1955  Age: 68 y.o. MRN: [id number]  Chief Complaint  Patient presents with   Follow-up    2 wk    Hypertension   Dizziness    Pt reports being dizzy  for 2-3 days     [name] is a 68 y.o. who presents to the clinic for a follow up of HTN, HFrecEF, HLD, feeling lightheaded, and concerns about bats in her house. Please see problem based assessment and plan for additional details.    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  Constitutional:      General: She is not in acute distress.    Appearance: She is not toxic-appearing.  Neck:     Vascular: No JVD.  Cardiovascular:     Rate and Rhythm: Normal rate and regular rhythm.     Heart sounds: No murmur heard. Pulmonary:     Effort: Pulmonary effort is normal. No respiratory distress.     Breath sounds: Normal breath  sounds. No rales.  Musculoskeletal:     Right lower leg: No edema.     Left lower leg: Edema (Trace pitting edema of LLE to the level of the ankle) present.  Skin:    General: Skin is warm and dry.  Neurological:     Mental Status: She is alert.  Psychiatric:        Mood and Affect: Mood and affect normal.      No results found for any visits on 11/12/23.  Last metabolic panel Lab Results  Component Value Date   GLUCOSE 102 (H) 01/05/2023   NA 138 01/05/2023   K 4.5 01/05/2023   CL 104 01/05/2023   CO2 29 01/05/2023   BUN 16 01/05/2023   CREATININE 0.83 01/05/2023   GFRNONAA >60 01/05/2023   CALCIUM  9.9 01/05/2023   PROT 6.7 08/11/2020   ALBUMIN 4.0 08/11/2020   LABGLOB 2.7 08/11/2020   AGRATIO 1.5 08/11/2020   BILITOT 0.3 08/11/2020   ALKPHOS 73 08/11/2020   AST 19 08/11/2020   ALT 21 08/11/2020   ANIONGAP 5 01/05/2023   Last lipids Lab Results  Component Value Date   CHOL 132 10/04/2022   HDL 62 10/04/2022   LDLCALC 55 10/04/2022   TRIG 75 10/04/2022  CHOLHDL 2.1 10/04/2022      The 10-year ASCVD risk score (Arnett DK, et al., 2019) is: 6.4%    Assessment & Plan:   Problem List Items Addressed This Visit       Cardiovascular and Mediastinum   Essential hypertension, benign (Chronic)   Lisinopril -hydrochlorothiazide  20-12.5 mg daily Checks at home every now and then 120/79 Patient presents with a history of hypertension with a blood pressure today of 127/52. Their hypertension is well controlled on a regimen ofLisinopril-hydrochlorothiazide  20-12.5 mg daily.  Patient reports symptoms of hypotension at home including dizziness/feeling light headed the past few days.   Plan: -Discontinue Lisinopril -hydrochlorothiazide  20-12.5 mg daily due to orthostatic hypotension -Start Lisinopril  10 mg  -Patient asked to keep track of BP and bring log in with her at follow up  -BMP needed at follow up appointment       Relevant Medications   lisinopril   (ZESTRIL ) 10 MG tablet     Other   Hyperlipidemia - Primary (Chronic)   Last lipid profile in 2024 showed a LDL 55.  Patient has continued Lipitor 20 mg.  Will obtain lipid profile today.      Relevant Medications   lisinopril  (ZESTRIL ) 10 MG tablet   Other Relevant Orders   Lipid Profile   Colon cancer screening   Relevant Orders   Ambulatory referral to Gastroenterology   H/O heart failure   Per prior documentation:  03/12/2008 patient presented to the hospital with dyspnea at rest, orthopnea, and paroxysmal nocturnal dyspnea.  A transthoracic echo was obtained which showed EF of 20% and diffuse hypokinesis.  Patient underwent invasive evaluation 03/13/2008.  Record not available however summary in discharge note from that time states that patient had no evidence of obstructive coronary artery disease, ventriculogram estimated EF to be 20 to 25%, and her LVEDP was 10 mmHg.  It was thought that her reduced EF could be secondary to hypertension.   I am unable to located records of her echocardiogram in 2010 or her appointment notes with Cardiology.  Patient's last echocardiogram was in 2024 with an LVEF recorded at 60 to 65%.  Patient denies symptoms of volume overload at this time.  Physical exam does not reveal rales, JVD, or bilateral pitting edema.  Plan: - Will start lisinopril  10 mg - Patient is limited on additional GDMT at this time including beta-blocker due to bradycardia throughout appointment.  May be able to add a SGLT 2 inhibitor at follow-up pending patient's resolution of lightheadedness/orthostatic hypotension.       Lightheadedness   Patient reports feeling lightheaded for the past 2 to 3 days.  Fortunately this is not present this morning.  She reports feeling lightheaded when she is sitting, standing, walking.  She stated that it is not constant and does go away after a bit.  She felt better after eating yesterday and felt that the lightheaded this got better then.  She  denies chest pain, shortness of breath, palpitations, and denies loss of consciousness.  Orthostatic vital signs were obtained which revealed positive changes.  I do suspect that patient's lightheadedness is due to her blood pressure regimen of lisinopril -hydrochlorothiazide .  Plan: -Discontinue lisinopril  hydrochlorothiazide  combination and start her on lisinopril  10 mg due to patient's concurrent comorbidity of heart failure with recovered ejection fraction.      Rabies, need for prophylactic vaccination against   Patient reports exposure to bats in her house.  She stated that her bathroom ceiling was being redone when she noticed bats  in the bathroom ( came through the ceiling from the attic).  She has contacted animal control who has yet to remove the bats from her attic.  Patient denies known bite from any bats.   With patient's prior and continued exposure to bats in her house, patient was notified to go to an emergency department for the need of prophylactic vaccination against rabies.  Patient understands the importance of undergoing the prophylactic vaccinations against rabies.  Will need to confirm patient's compliance with undergoing prophylactic vaccination against rabies at follow-up.  We discussed the importance of the bats being removed from her house as well, animal control is in process of attempting to remove the bats from her attic.      Other Visit Diagnoses       Need for influenza vaccination       Relevant Orders   Flu vaccine, recombinant, trivalent, inj     Breast cancer screening by mammogram       Relevant Orders   MM 3D SCREENING MAMMOGRAM BILATERAL BREAST     Encounter for immunization       Relevant Orders   Flu vaccine HIGH DOSE PF(Fluzone Trivalent) (Completed)       Return in about 4 weeks (around 12/10/2023) for BP .   Patient discussed with Dr. Shawn Damien Lease, DO

## 2023-11-12 NOTE — Assessment & Plan Note (Signed)
 Patient reports feeling lightheaded for the past 2 to 3 days.  Fortunately this is not present this morning.  She reports feeling lightheaded when she is sitting, standing, walking.  She stated that it is not constant and does go away after a bit.  She felt better after eating yesterday and felt that the lightheaded this got better then.  She denies chest pain, shortness of breath, palpitations, and denies loss of consciousness.  Orthostatic vital signs were obtained which revealed positive changes.  I do suspect that patient's lightheadedness is due to her blood pressure regimen of lisinopril -hydrochlorothiazide .  Plan: -Discontinue lisinopril  hydrochlorothiazide  combination and start her on lisinopril  10 mg due to patient's concurrent comorbidity of heart failure with recovered ejection fraction.

## 2023-11-12 NOTE — Assessment & Plan Note (Signed)
 Per prior documentation:  03/12/2008 patient presented to the hospital with dyspnea at rest, orthopnea, and paroxysmal nocturnal dyspnea.  A transthoracic echo was obtained which showed EF of 20% and diffuse hypokinesis.  Patient underwent invasive evaluation 03/13/2008.  Record not available however summary in discharge note from that time states that patient had no evidence of obstructive coronary artery disease, ventriculogram estimated EF to be 20 to 25%, and her LVEDP was 10 mmHg.  It was thought that her reduced EF could be secondary to hypertension.   I am unable to located records of her echocardiogram in 2010 or her appointment notes with Cardiology.  Patient's last echocardiogram was in 2024 with an LVEF recorded at 60 to 65%.  Patient denies symptoms of volume overload at this time.  Physical exam does not reveal rales, JVD, or bilateral pitting edema.  Plan: - Will start lisinopril  10 mg - Patient is limited on additional GDMT at this time including beta-blocker due to bradycardia throughout appointment.  May be able to add a SGLT 2 inhibitor at follow-up pending patient's resolution of lightheadedness/orthostatic hypotension.

## 2023-11-12 NOTE — ED Provider Notes (Signed)
 Patient presenting to the emergency department after seeing bats in her house, no known contact/bite/scratch but was recommended by her PCP to come to the emergency department for rabies shot.  Unfortunately I was informed by nursing staff that patient left due to long wait times, she left before I was able to evaluate her, therefore she did not receive rabies immunoglobulin/vaccine as she left without being seen.  She told nursing staff that she plans to return to the emergency department tomorrow.   [name] SAILOR, [place] 11/12/23 2047    Sarah Rocky, MD 11/13/23 1233

## 2023-11-12 NOTE — Assessment & Plan Note (Addendum)
 Patient reports exposure to bats in her house.  She stated that her bathroom ceiling was being redone when she noticed bats in the bathroom ( came through the ceiling from the attic).  She has contacted animal control who has yet to remove the bats from her attic.  Patient denies known bite from any bats.   With patient's prior and continued exposure to bats in her house, patient was notified to go to an emergency department for the need of prophylactic vaccination against rabies.  Patient understands the importance of undergoing the prophylactic vaccinations against rabies.  Will need to confirm patient's compliance with undergoing prophylactic vaccination against rabies at follow-up.  We discussed the importance of the bats being removed from her house as well, animal control is in process of attempting to remove the bats from her attic.

## 2023-11-12 NOTE — Addendum Note (Signed)
 Addended by: SHAWN SICK on: 11/12/2023 01:37 PM   Modules accepted: Level of Service

## 2023-11-13 ENCOUNTER — Emergency Department (HOSPITAL_BASED_OUTPATIENT_CLINIC_OR_DEPARTMENT_OTHER)
Admission: EM | Admit: 2023-11-13 | Discharge: 2023-11-13 | Disposition: A | Discharge status: Home / Self Care | Attending: Emergency Medicine | Admitting: Emergency Medicine

## 2023-11-13 DIAGNOSIS — Z2914 Encounter for prophylactic rabies immune globin: Secondary | ICD-10-CM | POA: Insufficient documentation

## 2023-11-13 DIAGNOSIS — Z23 Encounter for immunization: Secondary | ICD-10-CM | POA: Diagnosis not present

## 2023-11-13 DIAGNOSIS — Z203 Contact with and (suspected) exposure to rabies: Secondary | ICD-10-CM | POA: Diagnosis not present

## 2023-11-13 DIAGNOSIS — Z7982 Long term (current) use of aspirin: Secondary | ICD-10-CM | POA: Insufficient documentation

## 2023-11-13 LAB — LIPID PANEL
Chol/HDL Ratio: 2.5 ratio (ref 0.0–4.4)
Cholesterol, Total: 132 mg/dL (ref 100–199)
HDL: 53 mg/dL (ref >39)
LDL Chol Calc (NIH): 62 mg/dL (ref 0–99)
Triglycerides: 92 mg/dL (ref 0–149)
VLDL Cholesterol Cal: 17 mg/dL (ref 5–40)

## 2023-11-13 MED ORDER — RABIES IMMUNE GLOBULIN 1500 UNIT/10ML IJ SOLN
20.0000 [IU]/kg | Freq: Once | INTRAMUSCULAR | Status: AC
  Administered 2023-11-13: 1500 [IU] via INTRAMUSCULAR
  Filled 2023-11-13: qty 10

## 2023-11-13 MED ORDER — RABIES VACCINE, PCEC IM SUSR
1.0000 mL | Freq: Once | INTRAMUSCULAR | Status: AC
  Administered 2023-11-13: 1 mL via INTRAMUSCULAR
  Filled 2023-11-13: qty 1

## 2023-11-13 NOTE — Discharge Instructions (Signed)
 Please go to one of the [facility] urgent cares for administration of additional rabies vaccines.  You will need to have these done on 9/12, 9/16, 11/27/2023.

## 2023-11-13 NOTE — ED Notes (Signed)

## 2023-11-13 NOTE — ED Provider Notes (Signed)
 [place] EMERGENCY DEPARTMENT AT [facility] Provider Note   CSN: [id number] Arrival date & time: 11/13/23  1031     Patient presents with: Rabies Injection   [name] is a 68 y.o. female.   Patient here requesting prophylaxis for rabies.  Patient was advised by her PCP to come in to be evaluated.  Patient states that she had bats in her house on several occasions due to work that she was having on one of her ceilings.  She thinks that the bats were able to come from the attic into her house.  She had the bats removed, she estimates, 3-4 weeks ago.  She has been asymptomatic.  She does not know of any direct contact.  She has never had rabies vaccines in the past.       Prior to Admission medications   Medication Sig Start Date End Date Taking? Authorizing Provider  aspirin  EC 81 MG tablet Take 1 tablet (81 mg total) by mouth daily. Swallow whole. 10/04/22   Guilloud, Carolyn, MD  atorvastatin  (LIPITOR) 20 MG tablet Take 1 tablet (20 mg total) by mouth daily. 10/04/22   Guilloud, Carolyn, MD  cetirizine  (ZYRTEC ) 10 MG tablet Take 1 tablet (10 mg total) by mouth at bedtime. For allergies 08/11/20   Guilloud, Carolyn, MD  fluticasone  (FLONASE ) 50 MCG/ACT nasal spray Place 1 spray into both nostrils daily. 01/23/23 01/23/24  Amoako, Prince, MD  lisinopril  (ZESTRIL ) 10 MG tablet Take 1 tablet (10 mg total) by mouth daily. 11/12/23   Kandis Perkins, DO    Allergies: Patient has no known allergies.    Review of Systems  Updated Vital Signs BP 113/64 (BP Location: Right Arm)   Pulse (!) 57   Temp 98.1 F (36.7 C) (Oral)   Resp 17   SpO2 99%   Physical Exam Vitals and nursing note reviewed.  Constitutional:      Appearance: She is well-developed.  HENT:     Head: Normocephalic and atraumatic.  Eyes:     Conjunctiva/sclera: Conjunctivae normal.  Pulmonary:     Effort: No respiratory distress.  Musculoskeletal:     Cervical back: Normal range of motion and neck  supple.  Skin:    General: Skin is warm and dry.  Neurological:     Mental Status: She is alert.     (all labs ordered are listed, but only abnormal results are displayed) Labs Reviewed - No data to display  EKG: None  Radiology: No results found.   Procedures   Medications Ordered in the ED  Rabies Immune Globulin  SOLN 1,500 Units (has no administration in time range)  rabies vaccine  (RABAVERT ) injection 1 mL (has no administration in time range)   ED Course  Patient seen and examined. History obtained directly from patient.  I did discuss with ED pharmacist in regards to the rabies immunoglobulin.  The only stipulation noted, that IgG would not typically be given greater than 7 days after initial vaccine, if not given with initial vaccine.  Advised that patient receive IgG today.  Labs/EKG: None ordered  Imaging: None ordered  Medications/Fluids: Ordered: Rabies IgG and first vaccine.  Most recent vital signs reviewed and are as follows: BP 113/64 (BP Location: Right Arm)   Pulse (!) 57   Temp 98.1 F (36.7 C) (Oral)   Resp 17   SpO2 99%   Initial impression: Need for rabies prophylaxis.  Home treatment plan: Need for additional rabies boosters, day 3, day 7, day 14.  Return instructions discussed with patient: New or worsening symptoms  Follow-up instructions discussed with patient: Urgent care for continued series                                  Medical Decision Making Risk Prescription drug management.   Patient here for rabies prophylaxis.  Asymptomatic.     Final diagnoses:  Need for post exposure prophylaxis for rabies    ED Discharge Orders     None          Desiderio Chew, PA-C 11/13/23 1211    Jerrol Agent, MD 11/13/23 1712

## 2023-11-13 NOTE — ED Triage Notes (Signed)
 Patient states bats in her house since June. States got bats removed August 1st. Hasn't seen any bats in the past month. Saw PCP who said she needed to get checked out fro possible rabies exposure.

## 2023-11-14 ENCOUNTER — Ambulatory Visit: Payer: Self-pay | Admitting: Student

## 2023-11-16 ENCOUNTER — Ambulatory Visit (HOSPITAL_COMMUNITY)
Admission: EM | Admit: 2023-11-16 | Discharge: 2023-11-16 | Disposition: A | Discharge status: Home / Self Care | Attending: Family Medicine | Admitting: Family Medicine

## 2023-11-16 ENCOUNTER — Encounter (HOSPITAL_COMMUNITY): Payer: Self-pay

## 2023-11-16 DIAGNOSIS — Z203 Contact with and (suspected) exposure to rabies: Secondary | ICD-10-CM | POA: Diagnosis not present

## 2023-11-16 DIAGNOSIS — Z23 Encounter for immunization: Secondary | ICD-10-CM

## 2023-11-16 MED ORDER — RABIES VACCINE, PCEC IM SUSR
1.0000 mL | Freq: Once | INTRAMUSCULAR | Status: AC
  Administered 2023-11-16: 1 mL via INTRAMUSCULAR

## 2023-11-16 MED ORDER — RABIES VACCINE, PCEC IM SUSR
INTRAMUSCULAR | Status: AC
Start: 2023-11-16 — End: 2023-11-16
  Filled 2023-11-16: qty 1

## 2023-11-16 NOTE — ED Triage Notes (Signed)
 Pt here for second rabies vaccine . Denies any reactions to previous vaccine.

## 2023-11-20 ENCOUNTER — Ambulatory Visit (HOSPITAL_COMMUNITY)
Admission: EM | Admit: 2023-11-20 | Discharge: 2023-11-20 | Disposition: A | Discharge status: Home / Self Care | Attending: Family Medicine | Admitting: Family Medicine

## 2023-11-20 ENCOUNTER — Other Ambulatory Visit: Payer: Self-pay

## 2023-11-20 ENCOUNTER — Encounter (HOSPITAL_COMMUNITY): Payer: Self-pay | Admitting: Emergency Medicine

## 2023-11-20 DIAGNOSIS — Z23 Encounter for immunization: Secondary | ICD-10-CM | POA: Diagnosis not present

## 2023-11-20 MED ORDER — RABIES VACCINE, PCEC IM SUSR
1.0000 mL | Freq: Once | INTRAMUSCULAR | Status: AC
  Administered 2023-11-20: 1 mL via INTRAMUSCULAR

## 2023-11-20 MED ORDER — RABIES VACCINE, PCEC IM SUSR
INTRAMUSCULAR | Status: AC
  Filled 2023-11-20: qty 1

## 2023-11-20 NOTE — Discharge Instructions (Signed)
 Return as scheduled for final injection

## 2023-11-20 NOTE — ED Triage Notes (Signed)
 Patient is in department for rabies injection #3 in series.

## 2023-11-27 ENCOUNTER — Ambulatory Visit (HOSPITAL_COMMUNITY)

## 2023-11-27 ENCOUNTER — Ambulatory Visit (HOSPITAL_COMMUNITY)
Admission: EM | Admit: 2023-11-27 | Discharge: 2023-11-27 | Disposition: A | Discharge status: Home / Self Care | Attending: Internal Medicine | Admitting: Internal Medicine

## 2023-11-27 ENCOUNTER — Encounter (HOSPITAL_COMMUNITY): Payer: Self-pay

## 2023-11-27 DIAGNOSIS — Z203 Contact with and (suspected) exposure to rabies: Secondary | ICD-10-CM | POA: Diagnosis not present

## 2023-11-27 DIAGNOSIS — Z23 Encounter for immunization: Secondary | ICD-10-CM

## 2023-11-27 MED ORDER — RABIES VACCINE, PCEC IM SUSR
1.0000 mL | Freq: Once | INTRAMUSCULAR | Status: AC
  Administered 2023-11-27: 1 mL via INTRAMUSCULAR

## 2023-11-27 MED ORDER — RABIES VACCINE, PCEC IM SUSR
INTRAMUSCULAR | Status: AC
  Filled 2023-11-27: qty 1

## 2023-11-27 NOTE — ED Triage Notes (Signed)
 Pt here for fourth rabies vaccine.

## 2023-11-28 ENCOUNTER — Other Ambulatory Visit (HOSPITAL_COMMUNITY): Payer: Self-pay

## 2023-11-28 ENCOUNTER — Ambulatory Visit
Admission: RE | Admit: 2023-11-28 | Discharge: 2023-11-28 | Disposition: A | Discharge status: Home / Self Care | Source: Ambulatory Visit | Attending: Internal Medicine

## 2023-11-28 DIAGNOSIS — Z1231 Encounter for screening mammogram for malignant neoplasm of breast: Secondary | ICD-10-CM

## 2023-11-28 MED ORDER — ATORVASTATIN CALCIUM 20 MG PO TABS
20.0000 mg | ORAL_TABLET | Freq: Every day | ORAL | 0 refills | Status: DC
  Filled 2023-11-28: qty 90, 90d supply, fill #0

## 2023-11-29 ENCOUNTER — Telehealth: Payer: Self-pay | Admitting: *Deleted

## 2023-11-29 ENCOUNTER — Ambulatory Visit: Payer: Self-pay

## 2023-11-29 DIAGNOSIS — I10 Essential (primary) hypertension: Secondary | ICD-10-CM

## 2023-11-29 NOTE — Addendum Note (Signed)
 Addended by: ELICIA SHARPER on: 11/29/2023 03:07 PM   Modules accepted: Orders

## 2023-11-29 NOTE — Telephone Encounter (Signed)
 FYI Only or Action Required?: Action required by provider: clinical question for provider. Refusing appt at this time, would like to be put back on her hydrochlorothiazide , please call back and advise next steps.   Patient was last seen in primary care on 11/12/2023 by Kandis Perkins, DO.  Called Nurse Triage reporting Leg Swelling.  Symptoms began several days ago.  Interventions attempted: Nothing.  Symptoms are: gradually worsening.  Triage Disposition: See Physician Within 24 Hours  Patient/caregiver understands and will follow disposition?: No, wishes to speak with PCP  Copied from CRM #[id number]. Topic: Clinical - Red Word Triage >> Nov 29, 2023  8:38 AM Susanna ORN wrote: Red Word that prompted transfer to Nurse Triage: Patient called in stating that her doctor took her off some of her medications, including the Lisinopril /hydrochlorothiazide . Patient states now both of her feet/legs are swollen and has fluid on them. States they also hurts at times and she's worried. Wants to know what does she need to do? Reason for Disposition  [1] MODERATE leg swelling (e.g., swelling extends up to knees) AND [2] new-onset or getting worse  Answer Assessment - Initial Assessment Questions 1. ONSET: When did the swelling start? (e.g., minutes, hours, days)     Pt states about a week 2. LOCATION: What part of the leg is swollen?  Are both legs swollen or just one leg?     BLE 3. SEVERITY: How bad is the swelling? (e.g., localized; mild, moderate, severe)     severe 4. REDNESS: Is there redness or signs of infection?     denies 5. PAIN: Is the swelling painful to touch? If Yes, ask: How painful is it?   (Scale 1-10; mild, moderate or severe)     Intermittent, 6. FEVER: Do you have a fever? If Yes, ask: What is it, how was it measured, and when did it start?      denies 7. CAUSE: What do you think is causing the leg swelling?     Was recently taken off HCTZ 8. MEDICAL  HISTORY: Do you have a history of blood clots (e.g., DVT), cancer, heart failure, kidney disease, or liver failure?     Heart failure,  9. RECURRENT SYMPTOM: Have you had leg swelling before? If Yes, ask: When was the last time? What happened that time?     States she has but it has been awhile 10. OTHER SYMPTOMS: Do you have any other symptoms? (e.g., chest pain, difficulty breathing)       Denies  Pt would like to but put back on her hydrochlorothiazide , refusing appt at this time states, I was hoping to avoid coming in. Please call pt back and advise next steps.  Protocols used: Leg Swelling and Edema-A-AH

## 2023-11-29 NOTE — Telephone Encounter (Signed)
 Transfer call from patient.  Patient took the hydrochlorothiazide  an the combination pill of Lisinopril  and hydrochlorothiazide  on list night to decrease the swelling in her legs.  Patient stated that she took a blood pressure afterwards and it was 145/66.  Little decrease in the swelling.  Did void a lot. No dizziness or headaches.  Spoke with Dr. Zheng about .  Dr. Elicia called patient and spoke with her.

## 2023-11-30 ENCOUNTER — Other Ambulatory Visit: Payer: Self-pay | Admitting: Internal Medicine

## 2023-11-30 DIAGNOSIS — R928 Other abnormal and inconclusive findings on diagnostic imaging of breast: Secondary | ICD-10-CM

## 2023-11-30 MED ORDER — LISINOPRIL-HYDROCHLOROTHIAZIDE 20-12.5 MG PO TABS: 1.0000 | ORAL_TABLET | Freq: Every day | ORAL | Status: DC

## 2023-11-30 NOTE — Addendum Note (Signed)
 Addended by: ELICIA SHARPER on: 11/30/2023 11:01 AM   Modules accepted: Orders

## 2023-12-03 NOTE — Progress Notes (Signed)
 Internal Medicine Clinic Attending  Case discussed with the resident at the time of the visit.  We reviewed the resident's history and exam and pertinent patient test results.  I agree with the assessment, diagnosis, and plan of care documented in the resident's note.

## 2023-12-06 ENCOUNTER — Ambulatory Visit
Admission: RE | Admit: 2023-12-06 | Discharge: 2023-12-06 | Disposition: A | Source: Ambulatory Visit | Attending: Internal Medicine

## 2023-12-06 DIAGNOSIS — R928 Other abnormal and inconclusive findings on diagnostic imaging of breast: Secondary | ICD-10-CM | POA: Diagnosis not present

## 2023-12-06 DIAGNOSIS — N6042 Mammary duct ectasia of left breast: Secondary | ICD-10-CM | POA: Diagnosis not present

## 2023-12-12 ENCOUNTER — Ambulatory Visit

## 2023-12-12 ENCOUNTER — Encounter: Payer: Self-pay | Admitting: Internal Medicine

## 2023-12-12 VITALS — BP 120/66 | HR 56 | Temp 98.0°F | Ht 62.0 in | Wt 164.0 lb

## 2023-12-12 DIAGNOSIS — I110 Hypertensive heart disease with heart failure: Secondary | ICD-10-CM

## 2023-12-12 DIAGNOSIS — R42 Dizziness and giddiness: Secondary | ICD-10-CM

## 2023-12-12 DIAGNOSIS — I5020 Unspecified systolic (congestive) heart failure: Secondary | ICD-10-CM | POA: Diagnosis not present

## 2023-12-12 DIAGNOSIS — Z1211 Encounter for screening for malignant neoplasm of colon: Secondary | ICD-10-CM

## 2023-12-12 DIAGNOSIS — I10 Essential (primary) hypertension: Secondary | ICD-10-CM

## 2023-12-12 NOTE — Assessment & Plan Note (Signed)
 Gave patient the number to call to schedule colonoscopy

## 2023-12-12 NOTE — Patient Instructions (Addendum)
 Today we discussed the following medical conditions and plan:   Will get some basic blood work today to see where your kidney function and electrolytes are.  Then we will split up your medications.  Like we talked about you will be taking the lisinopril  20 mg.  We will then add 1 of 2 medications depending on what your labs look like today.  His medications are to help with your heart function  Please call to schedule your colonoscopy  This is the number for the stomach doctors to schedule your colonoscopy: Located in: [facility] [address] Address: [address], [place], [place] [address] Phone: [phone]  We look forward to seeing you next time. Please call our clinic at [phone] if you have any questions or concerns. The best time to call is Monday-Friday from 9am-4pm, but there is someone available 24/7. If you need medication refills, please notify your pharmacy one week in advance and they will send us  a request.   Thank you for trusting me with your care. Wishing you the best! We will see you back in a month to check on your blood pressure and see how you are doing on the new medications Ashanti Littles D'Mello, DO  [facility]

## 2023-12-12 NOTE — Assessment & Plan Note (Signed)
 On exam today patient does not have any rales, JVD, bilateral pitting edema.  Do think that her swelling could be to be stasis and recommended compression stockings for it.  As patient has history of recovered ejection fraction, we will titrate GDMT as if patient had HFrEF.  Plan: Depending on labs will either start spironolactone  12.5 mg versus SGLT2 inhibitor

## 2023-12-12 NOTE — Progress Notes (Signed)
 Established Patient Office Visit  Subjective   Patient ID: [name], female    DOB: [birth year]  Age: 68 y.o. MRN: [id number]  Chief Complaint  Patient presents with   Follow-up    HPI [name] is a 68 year old female with past medical history of prediabetes, hyperlipidemia, hypertension, allergic sinusitis that is here for follow-up about blood pressure and lightheadedness.  See problem-based assessment for more details.   ROS See problem-based assessment   Objective:     BP 120/66 (BP Location: Right Arm, Patient Position: Sitting, Cuff Size: Small)   Pulse (!) 56   Temp 98 F (36.7 C) (Oral)   Ht 5' 2 (1.575 m)   Wt 164 lb (74.4 kg)   SpO2 100%   BMI 30.00 kg/m  BP Readings from Last 3 Encounters:  12/12/23 120/66  11/13/23 113/64  11/12/23 127/86   Wt Readings from Last 3 Encounters:  12/12/23 164 lb (74.4 kg)  11/12/23 163 lb (73.9 kg)  11/12/23 168 lb 6.4 oz (76.4 kg)      Physical Exam Constitution: Alert, no acute distress Heart: Bradycardic and regular rhythm, no murmurs heard Lungs: Clear to auscultation no respiratory distress Neck: No JVD appreciated Lower extremities: No bilateral pitting edema appreciated  No results found for any visits on 12/12/23.  Last CBC Lab Results  Component Value Date   WBC 8.3 01/05/2023   HGB 13.3 01/05/2023   HCT 40.2 01/05/2023   MCV 87.8 01/05/2023   MCH 29.0 01/05/2023   RDW 12.4 01/05/2023   PLT 288 01/05/2023   Last metabolic panel Lab Results  Component Value Date   GLUCOSE 102 (H) 01/05/2023   NA 138 01/05/2023   K 4.5 01/05/2023   CL 104 01/05/2023   CO2 29 01/05/2023   BUN 16 01/05/2023   CREATININE 0.83 01/05/2023   GFRNONAA >60 01/05/2023   CALCIUM  9.9 01/05/2023   PROT 6.7 08/11/2020   ALBUMIN 4.0 08/11/2020   LABGLOB 2.7 08/11/2020   AGRATIO 1.5 08/11/2020   BILITOT 0.3 08/11/2020   ALKPHOS 73 08/11/2020   AST 19 08/11/2020   ALT 21 08/11/2020   ANIONGAP 5 01/05/2023       The 10-year ASCVD risk score (Arnett DK, et al., 2019) is: 6.1%    Assessment & Plan:   Problem List Items Addressed This Visit     Essential hypertension, benign (Chronic)   Due to patient's history of heart failure with recovered ejection fraction, will try to titrate GDMT as if patient had HFrEF.   Plan: Check BMP Will continue patient on lisinopril  20 mg alone, stop HCTZ 12.5 mg.  Will determine new medication based off of labs today. Depending on potassium could add spironolactone  12.5 mg or Jardiance/Farxiga 10 mg      Colon cancer screening   Gave patient the number to call to schedule colonoscopy      Heart failure with recovered ejection fraction (HFrecEF) (HCC)   On exam today patient does not have any rales, JVD, bilateral pitting edema.  Do think that her swelling could be to be stasis and recommended compression stockings for it.  As patient has history of recovered ejection fraction, we will titrate GDMT as if patient had HFrEF.  Plan: Depending on labs will either start spironolactone  12.5 mg versus SGLT2 inhibitor      Lightheadedness   Patient states since she last followed up that she has only had 1 episode of this that resolved when she took one of  her antihistamines.  Although orthostatics were positive, believe that this could be more due to inner ear etiology.  Will continue to monitor.  Patient might also have some venous stasis, although she does not have any swelling today appreciated in her legs, recommended that she use compression stockings when she does have swelling      Relevant Orders   CBC no Diff   Other Visit Diagnoses       Essential hypertension    -  Primary   Relevant Orders   Basic metabolic panel with GFR      Return in about 5 weeks (around 01/16/2024) for medication changes .    Sarah Verville D'Mello, DO Patient seen with Dr. Francesco

## 2023-12-12 NOTE — Assessment & Plan Note (Signed)
 Patient states since she last followed up that she has only had 1 episode of this that resolved when she took one of her antihistamines.  Although orthostatics were positive, believe that this could be more due to inner ear etiology.  Will continue to monitor.  Patient might also have some venous stasis, although she does not have any swelling today appreciated in her legs, recommended that she use compression stockings when she does have swelling

## 2023-12-12 NOTE — Assessment & Plan Note (Addendum)
 Due to patient's history of heart failure with recovered ejection fraction, will try to titrate GDMT as if patient had HFrEF.   Plan: Check BMP Will continue patient on lisinopril  20 mg alone, stop HCTZ 12.5 mg.  Will determine new medication based off of labs today. Depending on potassium could add spironolactone  12.5 mg or Jardiance/Farxiga 10 mg

## 2023-12-13 ENCOUNTER — Other Ambulatory Visit (HOSPITAL_COMMUNITY): Payer: Self-pay

## 2023-12-13 ENCOUNTER — Ambulatory Visit: Payer: Self-pay

## 2023-12-13 LAB — BASIC METABOLIC PANEL WITH GFR
BUN/Creatinine Ratio: 20 (ref 12–28)
BUN: 19 mg/dL (ref 8–27)
CO2: 22 mmol/L (ref 20–29)
Calcium: 9.6 mg/dL (ref 8.7–10.3)
Chloride: 102 mmol/L (ref 96–106)
Creatinine, Ser: 0.96 mg/dL (ref 0.57–1.00)
Glucose: 87 mg/dL (ref 70–99)
Potassium: 4.7 mmol/L (ref 3.5–5.2)
Sodium: 141 mmol/L (ref 134–144)
eGFR: 65 mL/min/1.73 (ref >59)

## 2023-12-13 LAB — CBC
Hematocrit: 41.3 % (ref 34.0–46.6)
Hemoglobin: 13.1 g/dL (ref 11.1–15.9)
MCH: 29 pg (ref 26.6–33.0)
MCHC: 31.7 g/dL (ref 31.5–35.7)
MCV: 92 fL (ref 79–97)
Platelets: 282 x10E3/uL (ref 150–450)
RBC: 4.51 x10E6/uL (ref 3.77–5.28)
RDW: 12.3 % (ref 11.7–15.4)
WBC: 8 x10E3/uL (ref 3.4–10.8)

## 2023-12-13 MED ORDER — LISINOPRIL 20 MG PO TABS
20.0000 mg | ORAL_TABLET | Freq: Every day | ORAL | 11 refills | Status: AC
  Filled 2023-12-13: qty 30, 30d supply, fill #0
  Filled 2024-01-17: qty 30, 30d supply, fill #1
  Filled 2024-02-25: qty 30, 30d supply, fill #2
  Filled 2024-04-07: qty 30, 30d supply, fill #3

## 2023-12-13 MED ORDER — EMPAGLIFLOZIN 10 MG PO TABS
10.0000 mg | ORAL_TABLET | Freq: Every day | ORAL | 2 refills | Status: AC
  Filled 2023-12-13: qty 30, 30d supply, fill #0

## 2023-12-13 NOTE — Addendum Note (Signed)
 Addended by: KEM NA on: 12/13/2023 02:46 PM   Modules accepted: Orders

## 2023-12-14 ENCOUNTER — Other Ambulatory Visit (HOSPITAL_COMMUNITY): Payer: Self-pay

## 2023-12-14 NOTE — Telephone Encounter (Signed)
 Call from pt Jardiance copay $487.88 (confirmed with pharmacy) Pt does not have commercial insurance and will not be able to use co-pay card.  Will send info to prescribing MD for an alternative medication.         (Silvers[employer]s)

## 2023-12-14 NOTE — Progress Notes (Signed)
 Internal Medicine Clinic Attending  I was physically present during the key portions of the resident provided service and participated in the medical decision making of patient's management care. I reviewed pertinent patient test results.  The assessment, diagnosis, and plan were formulated together and I agree with the documentation in the resident's note.  Feel her leg swelling is isolated and intermittent gravity dependent related to her weight gain and venous insufficiency.  Trial off thiazide and switch to a milder SGLT2i given her potassium levels, which is more in line with GDMT for HFrecEF and will likely reduce dizziness as thiazide is more potent especially given not hx of T2DM.  She will try compression hose and conservative measures if swelling recurs off thiazide.    Francesco Elsie NOVAK, MD

## 2023-12-18 ENCOUNTER — Other Ambulatory Visit (HOSPITAL_COMMUNITY): Payer: Self-pay

## 2023-12-18 ENCOUNTER — Telehealth: Payer: Self-pay

## 2023-12-18 NOTE — Progress Notes (Signed)
 Pharmacy Medication Assistance Program Note    12/18/2023  Patient ID: SHARMEKA PALMISANO, female   DOB: 09/05/1955, 68 y.o.   MRN: [id number]     12/18/2023  Outreach [employer]  Manufacturer Medication [employer]  [employer] [facility] Jardiance  Type of Audiological scientist Items Requested Application    Mailed to patient home 12/18/23

## 2023-12-18 NOTE — Telephone Encounter (Signed)
 A user error has taken place: encounter opened in error, closed for administrative reasons.

## 2023-12-18 NOTE — Telephone Encounter (Signed)
-----   Message from Livingston Wheeler D'Mello sent at 12/14/2023  3:44 PM EDT ----- Yes I was just about to message you! That would be great!  ----- Message ----- From: Rosina Lavern SAILOR, CPhT Sent: 12/14/2023   2:23 PM EDT To: Arland HERO Plyler, RD; Remonia Romano, DO  Patient may be eligible for assistance with [employer] for Jardiance since she is Medicare Part D. Just let me know and I can get the application sent to her.  ----- Message ----- From: Freddi Monta BROCKS, CMA Sent: 12/14/2023  12:02 PM EDT To: Arland HERO Plyler, RD; Lavern SAILOR Rosina, CPhT#  ----- Message from Monta BROCKS Freddi, CMA sent at 12/14/2023 12:02 PM EDT -----  Also CC'd CPht and Arland Plyler

## 2023-12-28 ENCOUNTER — Ambulatory Visit: Payer: Self-pay

## 2023-12-28 NOTE — Telephone Encounter (Signed)
 Pt stated she was on Lisinopril /hydrochlorothiazide  but the doctor took her off (10/8 OV) and now only on Lisinopril  20 mg. Stated the swelling is in her hands and legs. Offered pt an appt on Monday 10/27; pt stated she's going to start re-taking Lisinopril /hydrochlorothiazide  20 mg/ 12.5 mg over the w/e and if the swelling does  not go down, she will call Monday am for an appt.

## 2023-12-28 NOTE — Telephone Encounter (Signed)
 Patient reports lower leg and feet swelling that started last week. States swelling is better today. Endorses swelling is moderate in nature. Patient is asking for recommendations from provider. Did schedule patient for her follow up in November. Asking for a call back.  FYI Only or Action Required?: Action required by provider: clinical question for provider.  Patient was last seen in primary care on 12/12/2023 by D'Mello, Rosalyn, DO.  Called Nurse Triage reporting Leg Swelling.  Symptoms began a week ago.  Interventions attempted: Rest, hydration, or home remedies.  Symptoms are: unchanged.  Triage Disposition: See Physician Within 24 Hours  Patient/caregiver understands and will follow disposition?: No, wishes to speak with PCP  Copied from CRM #[id number]. Topic: Clinical - Red Word Triage >> Dec 28, 2023 10:34 AM Marda MATSU wrote: Red Word that prompted transfer to Nurse Triage: took the medication lisinopril  (ZESTRIL ) 20 MG tablet and feet a swollen or swelling. Reason for Disposition  [1] MODERATE leg swelling (e.g., swelling extends up to knees) AND [2] new-onset or getting worse  Answer Assessment - Initial Assessment Questions 1. ONSET: When did the swelling start? (e.g., minutes, hours, days)     Started last week 2. LOCATION: What part of the leg is swollen?  Are both legs swollen or just one leg?     From feet to the calf area 3. SEVERITY: How bad is the swelling? (e.g., localized; mild, moderate, severe)     Moderate 4. REDNESS: Is there redness or signs of infection?     no 5. PAIN: Is the swelling painful to touch? If Yes, ask: How painful is it?   (Scale 1-10; mild, moderate or severe)     no 6. FEVER: Do you have a fever? If Yes, ask: What is it, how was it measured, and when did it start?      no 7. CAUSE: What do you think is causing the leg swelling?     unsure 8. MEDICAL HISTORY: Do you have a history of blood clots (e.g., DVT),  cancer, heart failure, kidney disease, or liver failure?     Heart failure 9. RECURRENT SYMPTOM: Have you had leg swelling before? If Yes, ask: When was the last time? What happened that time?     Reports had similar swelling 10. OTHER SYMPTOMS: Do you have any other symptoms? (e.g., chest pain, difficulty breathing)       no  Protocols used: Leg Swelling and Edema-A-AH

## 2024-01-16 ENCOUNTER — Ambulatory Visit (INDEPENDENT_AMBULATORY_CARE_PROVIDER_SITE_OTHER): Payer: Self-pay | Admitting: Student

## 2024-01-16 VITALS — BP 159/71 | HR 58 | Temp 98.0°F | Ht 62.0 in | Wt 168.6 lb

## 2024-01-16 DIAGNOSIS — I10 Essential (primary) hypertension: Secondary | ICD-10-CM

## 2024-01-16 DIAGNOSIS — R600 Localized edema: Secondary | ICD-10-CM | POA: Diagnosis not present

## 2024-01-16 DIAGNOSIS — M7989 Other specified soft tissue disorders: Secondary | ICD-10-CM

## 2024-01-16 DIAGNOSIS — Z8679 Personal history of other diseases of the circulatory system: Secondary | ICD-10-CM | POA: Diagnosis not present

## 2024-01-16 NOTE — Patient Instructions (Addendum)
 Thank you so much for coming to the clinic today!   For your leg swelling, I would recommend using compression stockings and keeping your legs elevated above the level of your body.  For your blood pressure, I would like to see your numbers at home, please keep track of them and bring them to your next appointment. Please take it daily two hours after taking your lisinopril .   If you have any questions please feel free to the call the clinic at anytime at [phone]. It was a pleasure seeing you!  Best, Dr. Deliana Avalos

## 2024-01-17 NOTE — Progress Notes (Signed)
 Internal Medicine Clinic Attending  Case discussed with the resident at the time of the visit.  We reviewed the resident's history and exam and pertinent patient test results.  I agree with the assessment, diagnosis, and plan of care documented in the resident's note.

## 2024-01-17 NOTE — Progress Notes (Signed)
 CC: Follow-up appointment  HPI:  [name] is a 68 y.o. female living with a history stated below and presents today for a follow-up regarding her blood pressure. Please see problem based assessment and plan for additional details.  Discussed the use of AI scribe software for clinical note transcription with the patient, who gave verbal consent to proceed.  History of Present Illness [name] is a 67 year old female with hypertension who presents with leg swelling.  She experienced leg swelling after discontinuing hydrochlorothiazide  due to dizziness. The swelling began three to four days after stopping the medication and has persisted despite contacting the clinic twice. She has not been using compression stockings and has not effectively tried elevating her legs. She walks frequently and notes that her legs do not hurt despite the swelling.  She is currently taking lisinopril  20 mg in the evening and has not been using hydrochlorothiazide  for a while. Her blood pressure was high for a period but has been stable at home, with a recent reading of 120/80 mmHg about a week ago. She has not been taking Jardiance due to cost concerns and has not filled out the necessary paperwork for assistance programs.  In the past, she experienced dizziness and was referred to a specialist for ear issues, which were resolved. No shortness of breath and she notes that her skin is dry. She has not been checking her blood pressure regularly at home recently.    Past Medical History:  Diagnosis Date   CHF (congestive heart failure) (HCC)    Chronic diarrhea    Hypertension     Current Outpatient Medications on File Prior to Visit  Medication Sig Dispense Refill   aspirin  EC 81 MG tablet Take 1 tablet (81 mg total) by mouth daily. Swallow whole. 30 tablet 12   atorvastatin  (LIPITOR) 20 MG tablet Take 1 tablet (20 mg total) by mouth daily. 90 tablet 0   cetirizine  (ZYRTEC ) 10 MG tablet Take 1  tablet (10 mg total) by mouth at bedtime. For allergies 90 tablet 3   empagliflozin (JARDIANCE) 10 MG TABS tablet Take 1 tablet (10 mg total) by mouth daily. 30 tablet 2   fluticasone  (FLONASE ) 50 MCG/ACT nasal spray Place 1 spray into both nostrils daily. 16 g 11   lisinopril  (ZESTRIL ) 20 MG tablet Take 1 tablet (20 mg total) by mouth daily. 30 tablet 11   No current facility-administered medications on file prior to visit.    Family History  Problem Relation Age of Onset   Heart attack Brother 20   Heart disease Father 2       cath in his 58s.    Diabetes Father    Diabetes Brother    Breast cancer Mother 31    Social History   Socioeconomic History   Marital status: Married    Spouse name: Not on file   Number of children: Not on file   Years of education: Not on file   Highest education level: Not on file  Occupational History   Not on file  Tobacco Use   Smoking status: Never   Smokeless tobacco: Never  Vaping Use   Vaping status: Never Used  Substance and Sexual Activity   Alcohol use: No    Alcohol/week: 0.0 standard drinks of alcohol   Drug use: No   Sexual activity: Yes  Other Topics Concern   Not on file  Social History Narrative   Married and lives with husband in  [place].  2 boys and 1 girl.  Former associate professor.  High school graduate with some college but no degree (for education).     Social Drivers of Corporate Investment Banker Strain: Low Risk  (01/16/2024)   Overall Financial Resource Strain (CARDIA)    Difficulty of Paying Living Expenses: Not hard at all  Food Insecurity: No Food Insecurity (01/16/2024)   Hunger Vital Sign    Worried About Running Out of Food in the Last Year: Never true    Ran Out of Food in the Last Year: Never true  Transportation Needs: No Transportation Needs (01/16/2024)   PRAPARE - Administrator, Civil Service (Medical): No    Lack of Transportation (Non-Medical): No  Physical Activity: Unknown  (01/16/2024)   Exercise Vital Sign    Days of Exercise per Week: Not on file    Minutes of Exercise per Session: 20 min  Stress: No Stress Concern Present (01/16/2024)   [employer] of Occupational Health - Occupational Stress Questionnaire    Feeling of Stress: Not at all  Social Connections: Moderately Isolated (01/16/2024)   Social Connection and Isolation Panel    Frequency of Communication with Friends and Family: More than three times a week    Frequency of Social Gatherings with Friends and Family: Twice a week    Attends Religious Services: More than 4 times per year    Active Member of [employer] or Organizations: No    Attends Banker Meetings: Never    Marital Status: Widowed  Intimate Partner Violence: Not At Risk (01/16/2024)   Humiliation, Afraid, Rape, and Kick questionnaire    Fear of Current or Ex-Partner: No    Emotionally Abused: No    Physically Abused: No    Sexually Abused: No    Review of Systems: ROS negative except for what is noted on the assessment and plan.  Vitals:   01/16/24 1043 01/16/24 1111  BP: (!) 147/75 (!) 159/71  Pulse: (!) 50 (!) 58  Temp: 98 F (36.7 C)   TempSrc: Oral   SpO2: 98%   Weight: 168 lb 9.6 oz (76.5 kg)   Height: 5' 2 (1.575 m)     Physical Exam: Constitutional: well-appearing female in no acute distress Cardiovascular: regular rate and rhythm, no m/r/g, +1 lower extremity edema bilaterally Pulmonary/Chest: normal work of breathing on room air, lungs clear to auscultation bilaterally Abdominal: soft, non-tender, non-distended  Assessment & Plan:   Lower extremity edema Lower extremity edema became present after stopping her hydrochlorothiazide , however her dizziness did resolve after stopping the hydrochlorothiazide .  She does have a history of heart failure with preserved ejection fraction.  Do not believe she is in heart failure exacerbation at the moment, with her lungs clear to auscultation, and no  reports of shortness of breath.  She does have some pitting edema of her lower extremities bilaterally, with some probable venous stasis as well.    Plan: - Recommended compression stockings. - Advised leg elevation above heart level.  Essential hypertension, benign Blood pressure elevated in office but controlled at home with lisinopril . Home readings around 120/80 mmHg. Avoiding new antihypertensive due to hypotension risk. - Check blood pressure at home two hours post-lisinopril  and record. - Bring recorded readings, and blood pressure cuff to next appointment.    Patient discussed with Dr. Karna Dirks Mikaelah Trostle, M.D. [facility], PGY-3 Pager: [phone] Date 01/17/2024 Time 9:01 AM

## 2024-01-17 NOTE — Assessment & Plan Note (Signed)
 Lower extremity edema became present after stopping her hydrochlorothiazide , however her dizziness did resolve after stopping the hydrochlorothiazide .  She does have a history of heart failure with preserved ejection fraction.  Do not believe she is in heart failure exacerbation at the moment, with her lungs clear to auscultation, and no reports of shortness of breath.  She does have some pitting edema of her lower extremities bilaterally, with some probable venous stasis as well.    Plan: - Recommended compression stockings. - Advised leg elevation above heart level.

## 2024-01-17 NOTE — Addendum Note (Signed)
 Addended by: KARNA FELLOWS on: 01/17/2024 10:52 AM   Modules accepted: Level of Service

## 2024-01-17 NOTE — Assessment & Plan Note (Signed)
 Blood pressure elevated in office but controlled at home with lisinopril . Home readings around 120/80 mmHg. Avoiding new antihypertensive due to hypotension risk. - Check blood pressure at home two hours post-lisinopril  and record. - Bring recorded readings, and blood pressure cuff to next appointment.

## 2024-01-22 ENCOUNTER — Other Ambulatory Visit (HOSPITAL_COMMUNITY): Payer: Self-pay

## 2024-02-06 ENCOUNTER — Encounter: Payer: Self-pay | Admitting: Internal Medicine

## 2024-02-20 ENCOUNTER — Ambulatory Visit

## 2024-02-20 ENCOUNTER — Other Ambulatory Visit (HOSPITAL_COMMUNITY): Payer: Self-pay

## 2024-02-20 VITALS — Ht 62.0 in | Wt 168.0 lb

## 2024-02-20 DIAGNOSIS — Z1211 Encounter for screening for malignant neoplasm of colon: Secondary | ICD-10-CM

## 2024-02-20 MED ORDER — NA SULFATE-K SULFATE-MG SULF 17.5-3.13-1.6 GM/177ML PO SOLN
1.0000 | Freq: Once | ORAL | 0 refills | Status: AC
  Filled 2024-02-20: qty 354, 1d supply, fill #0

## 2024-02-20 NOTE — Progress Notes (Signed)

## 2024-02-21 ENCOUNTER — Other Ambulatory Visit: Payer: Self-pay

## 2024-02-21 ENCOUNTER — Other Ambulatory Visit (HOSPITAL_COMMUNITY): Payer: Self-pay

## 2024-02-21 MED ORDER — ATORVASTATIN CALCIUM 20 MG PO TABS
20.0000 mg | ORAL_TABLET | Freq: Every day | ORAL | 0 refills | Status: AC
  Filled 2024-02-21: qty 90, 90d supply, fill #0

## 2024-02-21 NOTE — Telephone Encounter (Signed)
 Copied from CRM #[id number]. Topic: Clinical - Medication Refill >> Feb 21, 2024 10:13 AM Carrielelia G wrote: Medication: atorvastatin  (LIPITOR) 20 MG tablet  Has the patient contacted their pharmacy? No (Agent: If no, request that the patient contact the pharmacy for the refill. If patient does not wish to contact the pharmacy document the reason why and proceed with request.) (Agent: If yes, when and what did the pharmacy advise?)  This is the patient's preferred pharmacy:  [facility] [address] [place] [address] Phone: [phone] Fax: [phone]  Is this the correct pharmacy for this prescription? Yes If no, delete pharmacy and type the correct one.    Can we respond through MyChart? No  Agent: Please be advised that Rx refills may take up to 3 business days. We ask that you follow-up with your pharmacy.

## 2024-03-14 ENCOUNTER — Telehealth: Payer: Self-pay | Admitting: Internal Medicine

## 2024-03-14 NOTE — Telephone Encounter (Signed)
 Inbound call from patient requesting to know when to stop taking her 81 mg Aspirin . Patient is requesting a call back. Please advise.

## 2024-03-14 NOTE — Telephone Encounter (Signed)
 Returned call, informed she could continue her ASA 81 mg SChaplin, RN,BSN

## 2024-03-16 ENCOUNTER — Other Ambulatory Visit: Payer: Self-pay

## 2024-03-16 ENCOUNTER — Emergency Department (HOSPITAL_BASED_OUTPATIENT_CLINIC_OR_DEPARTMENT_OTHER)
Admission: EM | Admit: 2024-03-16 | Discharge: 2024-03-16 | Disposition: A | Discharge status: Home / Self Care | Attending: Emergency Medicine | Admitting: Emergency Medicine

## 2024-03-16 ENCOUNTER — Encounter (HOSPITAL_BASED_OUTPATIENT_CLINIC_OR_DEPARTMENT_OTHER): Payer: Self-pay | Admitting: *Deleted

## 2024-03-16 DIAGNOSIS — R519 Headache, unspecified: Secondary | ICD-10-CM | POA: Diagnosis present

## 2024-03-16 DIAGNOSIS — Z7982 Long term (current) use of aspirin: Secondary | ICD-10-CM | POA: Diagnosis not present

## 2024-03-16 DIAGNOSIS — J329 Chronic sinusitis, unspecified: Secondary | ICD-10-CM | POA: Insufficient documentation

## 2024-03-16 MED ORDER — AMOXICILLIN 500 MG PO CAPS: 500.0000 mg | ORAL_CAPSULE | Freq: Three times a day (TID) | ORAL | 0 refills | Status: AC

## 2024-03-16 NOTE — Discharge Instructions (Addendum)
 You should perform a sinus rinse.  This will help with your symptoms.  I have sent an antibiotic into the pharmacy but this will not help with your symptoms.  This will only help if there is a bacteria that is causing your infection it would alumni the bacteria however your sinus congestion will persist.  Return for any emergent symptoms. Your blood pressure was elevated today.  Keep a close eye on this at home.  If you develop chest pain, shortness of breath, balance issues, or vision change return to the emergency department.  Otherwise please follow-up with your primary care doctor regarding this

## 2024-03-16 NOTE — ED Provider Notes (Signed)
 " [place] EMERGENCY DEPARTMENT AT [facility] Provider Note   CSN: [id number] Arrival date & time: 03/16/24  1706     Patient presents with: Facial Pain   [name] is a 69 y.o. female.   69 year old female presents today for concern of sinus pressure that has been ongoing for a week and worsening.  Has tried over-the-counter cough and cold medicine without any relief.  Has not tried a sinus rinse.  Denies any fever, cough.  The history is provided by the patient. No language interpreter was used.       Prior to Admission medications  Medication Sig Start Date End Date Taking? Authorizing Provider  amoxicillin  (AMOXIL ) 500 MG capsule Take 1 capsule (500 mg total) by mouth 3 (three) times daily for 7 days. 03/16/24 03/23/24 Yes Hildegard, Reshanda Lewey, PA-C  aspirin  EC 81 MG tablet Take 1 tablet (81 mg total) by mouth daily. Swallow whole. 10/04/22   Guilloud, Carolyn, MD  atorvastatin  (LIPITOR) 20 MG tablet Take 1 tablet (20 mg total) by mouth daily. 02/21/24   Nguyen, Diana, MD  cetirizine  (ZYRTEC ) 10 MG tablet Take 1 tablet (10 mg total) by mouth at bedtime. For allergies 08/11/20   Guilloud, Carolyn, MD  empagliflozin  (JARDIANCE ) 10 MG TABS tablet Take 1 tablet (10 mg total) by mouth daily. Patient not taking: Reported on 02/20/2024 12/13/23   D'Mello, Rosalyn, DO  fluticasone  (FLONASE ) 50 MCG/ACT nasal spray Place 1 spray into both nostrils daily. 01/23/23 02/20/24  Amoako, Prince, MD  lisinopril  (ZESTRIL ) 20 MG tablet Take 1 tablet (20 mg total) by mouth daily. 12/13/23 12/12/24  D'Mello, Rosalyn, DO    Allergies: Patient has no known allergies.    Review of Systems  Constitutional:  Negative for chills and fever.  HENT:  Positive for congestion, postnasal drip and sinus pressure. Negative for sore throat.   Respiratory:  Negative for cough and shortness of breath.   Cardiovascular:  Negative for chest pain.  All other systems reviewed and are negative.   Updated Vital  Signs BP (!) 169/96   Pulse 66   Temp 97.9 F (36.6 C)   Resp 20   SpO2 98%   Physical Exam Vitals and nursing note reviewed.  Constitutional:      General: She is not in acute distress.    Appearance: Normal appearance. She is not ill-appearing.  HENT:     Head: Normocephalic and atraumatic.     Nose: Nose normal.  Eyes:     Conjunctiva/sclera: Conjunctivae normal.  Cardiovascular:     Rate and Rhythm: Normal rate and regular rhythm.  Pulmonary:     Effort: Pulmonary effort is normal. No respiratory distress.  Musculoskeletal:        General: No deformity. Normal range of motion.  Skin:    Findings: No rash.  Neurological:     Mental Status: She is alert.     (all labs ordered are listed, but only abnormal results are displayed) Labs Reviewed - No data to display  EKG: None  Radiology: No results found.   Procedures   Medications Ordered in the ED - No data to display                                  Medical Decision Making Risk Prescription drug management.   69 year old female presents today for concern of sinus congestion, sinus pressure ongoing for about a week and worsening.  Discussed importance of sinus rinse.  I pulled up a YouTube video while in the room with her that showed her a demonstration.  Will give her an antibiotic since it has been going on for about a week and worsening.  But advised her that this will not help with her symptoms and that she will need to do sinus rinse regardless.  Return precautions discussed.  Discharged in stable condition.  Final diagnoses:  Sinusitis, unspecified chronicity, unspecified location    ED Discharge Orders          Ordered    amoxicillin  (AMOXIL ) 500 MG capsule  3 times daily        03/16/24 1742               Hildegard Loge, PA-C 03/16/24 1744  "

## 2024-03-16 NOTE — ED Triage Notes (Signed)
 Pt is here for sinus pain,  pt states that she thinks she has a sinus infection.  Sinus headache x1 week, no relief with otc treatments.

## 2024-03-19 ENCOUNTER — Ambulatory Visit: Admitting: Internal Medicine

## 2024-03-19 ENCOUNTER — Encounter: Payer: Self-pay | Admitting: Internal Medicine

## 2024-03-19 VITALS — BP 103/66 | HR 65 | Temp 97.3°F | Resp 11 | Ht 62.0 in | Wt 168.0 lb

## 2024-03-19 DIAGNOSIS — Z1211 Encounter for screening for malignant neoplasm of colon: Secondary | ICD-10-CM | POA: Diagnosis present

## 2024-03-19 DIAGNOSIS — D120 Benign neoplasm of cecum: Secondary | ICD-10-CM

## 2024-03-19 DIAGNOSIS — K5730 Diverticulosis of large intestine without perforation or abscess without bleeding: Secondary | ICD-10-CM | POA: Diagnosis not present

## 2024-03-19 DIAGNOSIS — K648 Other hemorrhoids: Secondary | ICD-10-CM

## 2024-03-19 MED ORDER — SODIUM CHLORIDE 0.9 % IV SOLN: 500.0000 mL | Freq: Once | INTRAVENOUS | Status: DC

## 2024-03-19 MED ORDER — SODIUM CHLORIDE 0.9 % IV SOLN: 500.0000 mL | Freq: Once | INTRAVENOUS | Status: AC

## 2024-03-19 NOTE — Op Note (Addendum)
 [facility] Patient Name: [name] Procedure Date: 03/19/2024 8:57 AM MRN: [id number] Endoscopist: Rosario Estefana Kidney , , [id number] Age: 69 Referring MD:  Date of Birth: Apr 08, 1955 Gender: Female Account #: [id number] Procedure:                Colonoscopy Indications:              Screening for colorectal malignant neoplasm Medicines:                Monitored Anesthesia Care Procedure:                Pre-Anesthesia Assessment:                           - Prior to the procedure, a History and Physical                            was performed, and patient medications and                            allergies were reviewed. The patient's tolerance of                            previous anesthesia was also reviewed. The risks                            and benefits of the procedure and the sedation                            options and risks were discussed with the patient.                            All questions were answered, and informed consent                            was obtained. Prior Anticoagulants: The patient has                            taken no anticoagulant or antiplatelet agents. ASA                            Grade Assessment: II - A patient with mild systemic                            disease. After reviewing the risks and benefits,                            the patient was deemed in satisfactory condition to                            undergo the procedure.                           After obtaining informed consent, the colonoscope  was passed under direct vision. Throughout the                            procedure, the patient's blood pressure, pulse, and                            oxygen saturations were monitored continuously. The                            CF [id number] #[id number] was introduced through the anus                            and advanced to the the terminal ileum. The                            colonoscopy was  performed without difficulty. The                            patient tolerated the procedure well. The quality                            of the bowel preparation was good. The terminal                            ileum, ileocecal valve, appendiceal orifice, and                            rectum were photographed. Scope In: 9:09:48 AM Scope Out: 9:23:56 AM Scope Withdrawal Time: 0 hours 8 minutes 58 seconds  Total Procedure Duration: 0 hours 14 minutes 8 seconds  Findings:                 The terminal ileum appeared normal.                           A 4 mm polyp was found in the cecum. The polyp was                            sessile. The polyp was removed with a cold snare.                            Resection and retrieval were complete.                           Multiple diverticula were found in the entire colon.                           Non-bleeding internal hemorrhoids were found during                            retroflexion. Complications:            No immediate complications. Estimated Blood Loss:     Estimated blood loss was minimal. Impression:               -  The examined portion of the ileum was normal.                           - One 4 mm polyp in the cecum, removed with a cold                            snare. Resected and retrieved.                           - Diverticulosis in the entire examined colon.                           - Non-bleeding internal hemorrhoids. Recommendation:           - Discharge patient to home (with escort).                           - Await pathology results.                           - You were noted to scattered PVCs prior to your                            colonoscopy procedure (asymptomatic, vitals                            normal). Please discuss this with your primary care                            physician to see if any further evaluation will be                            needed.                           - The findings and  recommendations were discussed                            with the patient. Dr Estefana Federico Rosario Estefana Federico,  03/19/2024 9:28:13 AM

## 2024-03-19 NOTE — Progress Notes (Signed)
 "   GASTROENTEROLOGY PROCEDURE H&P NOTE   Primary Care Physician: Leontine Lapine, MD    Reason for Procedure:   Colon cancer screening  Plan:    Colonoscopy  Patient is appropriate for endoscopic procedure(s) in the ambulatory ([facility]) setting.  The nature of the procedure, as well as the risks, benefits, and alternatives were carefully and thoroughly reviewed with the patient. Ample time for discussion and questions allowed. The patient understood, was satisfied, and agreed to proceed.     HPI: [name] is a 69 y.o. female who presents for colonoscopy for colon cancer screening. Denies blood in stools, changes in bowel habits, or unintentional weight loss. Denies family history of colon cancer. Her last colonoscopy was >10 years ago and was normal.   Past Medical History:  Diagnosis Date   Allergy    CHF (congestive heart failure) (HCC)    Chronic diarrhea    Hyperlipidemia    Hypertension     Past Surgical History:  Procedure Laterality Date   ABDOMINAL HYSTERECTOMY     ABDOMINAL HYSTERECTOMY W/ PARTIAL VAGINACTOMY  2000   for fibroids   CARDIAC CATHETERIZATION  2010    Prior to Admission medications  Medication Sig Start Date End Date Taking? Authorizing Provider  amoxicillin  (AMOXIL ) 500 MG capsule Take 1 capsule (500 mg total) by mouth 3 (three) times daily for 7 days. 03/16/24 03/23/24 Yes Ali, Amjad, PA-C  aspirin  EC 81 MG tablet Take 1 tablet (81 mg total) by mouth daily. Swallow whole. 10/04/22  Yes Guilloud, Carolyn, MD  atorvastatin  (LIPITOR) 20 MG tablet Take 1 tablet (20 mg total) by mouth daily. 02/21/24  Yes Nguyen, Diana, MD  lisinopril  (ZESTRIL ) 20 MG tablet Take 1 tablet (20 mg total) by mouth daily. 12/13/23 12/12/24 Yes D'Mello, Rosalyn, DO  cetirizine  (ZYRTEC ) 10 MG tablet Take 1 tablet (10 mg total) by mouth at bedtime. For allergies 08/11/20   Guilloud, Carolyn, MD  empagliflozin  (JARDIANCE ) 10 MG TABS tablet Take 1 tablet (10 mg total) by mouth  daily. Patient not taking: Reported on 02/20/2024 12/13/23   D'Mello, Rosalyn, DO  fluticasone  (FLONASE ) 50 MCG/ACT nasal spray Place 1 spray into both nostrils daily. 01/23/23 02/20/24  Renne Homans, MD    Current Outpatient Medications  Medication Sig Dispense Refill   amoxicillin  (AMOXIL ) 500 MG capsule Take 1 capsule (500 mg total) by mouth 3 (three) times daily for 7 days. 21 capsule 0   aspirin  EC 81 MG tablet Take 1 tablet (81 mg total) by mouth daily. Swallow whole. 30 tablet 12   atorvastatin  (LIPITOR) 20 MG tablet Take 1 tablet (20 mg total) by mouth daily. 90 tablet 0   lisinopril  (ZESTRIL ) 20 MG tablet Take 1 tablet (20 mg total) by mouth daily. 30 tablet 11   cetirizine  (ZYRTEC ) 10 MG tablet Take 1 tablet (10 mg total) by mouth at bedtime. For allergies 90 tablet 3   empagliflozin  (JARDIANCE ) 10 MG TABS tablet Take 1 tablet (10 mg total) by mouth daily. (Patient not taking: Reported on 02/20/2024) 30 tablet 2   fluticasone  (FLONASE ) 50 MCG/ACT nasal spray Place 1 spray into both nostrils daily. 16 g 11   Current Facility-Administered Medications  Medication Dose Route Frequency Provider Last Rate Last Admin   0.9 %  sodium chloride  infusion  500 mL Intravenous Once Harrie Cazarez C, MD       0.9 %  sodium chloride  infusion  500 mL Intravenous Once Zsazsa Bahena C, MD  Allergies as of 03/19/2024   (No Known Allergies)    Family History  Problem Relation Age of Onset   Breast cancer Mother 66   Heart disease Father 79       cath in his 77s.    Diabetes Father    Heart attack Brother 50   Diabetes Brother    Colon cancer Neg Hx    Colon polyps Neg Hx    Esophageal cancer Neg Hx    Rectal cancer Neg Hx    Stomach cancer Neg Hx     Social History   Socioeconomic History   Marital status: Married    Spouse name: Not on file   Number of children: Not on file   Years of education: Not on file   Highest education level: Not on file  Occupational History   Not  on file  Tobacco Use   Smoking status: Never   Smokeless tobacco: Never  Vaping Use   Vaping status: Never Used  Substance and Sexual Activity   Alcohol use: No    Alcohol/week: 0.0 standard drinks of alcohol   Drug use: No   Sexual activity: Yes  Other Topics Concern   Not on file  Social History Narrative   Married and lives with husband in [place].  2 boys and 1 girl.  Former associate professor.  High school graduate with some college but no degree (for education).     Social Drivers of Health   Tobacco Use: Low Risk (03/16/2024)   Patient History    Smoking Tobacco Use: Never    Smokeless Tobacco Use: Never    Passive Exposure: Not on file  Financial Resource Strain: Low Risk (01/16/2024)   Overall Financial Resource Strain (CARDIA)    Difficulty of Paying Living Expenses: Not hard at all  Food Insecurity: No Food Insecurity (01/16/2024)   Epic    Worried About Programme Researcher, Broadcasting/film/video in the Last Year: Never true    Ran Out of Food in the Last Year: Never true  Transportation Needs: No Transportation Needs (01/16/2024)   Epic    Lack of Transportation (Medical): No    Lack of Transportation (Non-Medical): No  Physical Activity: Unknown (01/16/2024)   Exercise Vital Sign    Days of Exercise per Week: Not on file    Minutes of Exercise per Session: 20 min  Stress: No Stress Concern Present (01/16/2024)   [employer] of Occupational Health - Occupational Stress Questionnaire    Feeling of Stress: Not at all  Social Connections: Moderately Isolated (01/16/2024)   Social Connection and Isolation Panel    Frequency of Communication with Friends and Family: More than three times a week    Frequency of Social Gatherings with Friends and Family: Twice a week    Attends Religious Services: More than 4 times per year    Active Member of [employer] or Organizations: No    Attends Banker Meetings: Never    Marital Status: Widowed  Intimate Partner Violence: Not At Risk  (01/16/2024)   Epic    Fear of Current or Ex-Partner: No    Emotionally Abused: No    Physically Abused: No    Sexually Abused: No  Depression (PHQ2-9): Low Risk (11/12/2023)   Depression (PHQ2-9)    PHQ-2 Score: 0  Alcohol Screen: Low Risk (01/16/2024)   Alcohol Screen    Last Alcohol Screening Score (AUDIT): 0  Housing: Low Risk (01/16/2024)   Epic    Unable to  Pay for Housing in the Last Year: No    Number of Times Moved in the Last Year: 0    Homeless in the Last Year: No  Utilities: Not At Risk (01/16/2024)   Epic    Threatened with loss of utilities: No  Health Literacy: Adequate Health Literacy (01/16/2024)   B1300 Health Literacy    Frequency of need for help with medical instructions: Never    Physical Exam: Vital signs in last 24 hours: BP 124/83   Pulse (!) 51   Temp (!) 97.3 F (36.3 C)   Ht 5' 2 (1.575 m)   Wt 168 lb (76.2 kg)   SpO2 96%   BMI 30.73 kg/m  GEN: NAD EYE: Sclerae anicteric ENT: MMM CV: Non-tachycardic Pulm: No increased work of breathing GI: Soft, NT/ND NEURO:  Alert & Oriented   Estefana Kidney, MD [facility]  03/19/2024 8:28 AM  "

## 2024-03-19 NOTE — Progress Notes (Signed)
 To pacu, VSS. Report to [email or web address]

## 2024-03-19 NOTE — Progress Notes (Signed)
 Pt's states no medical or surgical changes since previsit or office visit.

## 2024-03-19 NOTE — Progress Notes (Signed)
 Called to room to assist during endoscopic procedure.  Patient ID and intended procedure confirmed with present staff. Received instructions for my participation in the procedure from the performing physician.

## 2024-03-19 NOTE — Patient Instructions (Signed)
-  Await pathology results -Handout on polyps, hemorrhoids and diverticulosisprovided  YOU HAD AN ENDOSCOPIC PROCEDURE TODAY AT THE [facility]:   Refer to the procedure report that was given to you for any specific questions about what was found during the examination.  If the procedure report does not answer your questions, please call your gastroenterologist to clarify.  If you requested that your care partner not be given the details of your procedure findings, then the procedure report has been included in a sealed envelope for you to review at your convenience later.  YOU SHOULD EXPECT: Some feelings of bloating in the abdomen. Passage of more gas than usual.  Walking can help get rid of the air that was put into your GI tract during the procedure and reduce the bloating. If you had a lower endoscopy (such as a colonoscopy or flexible sigmoidoscopy) you may notice spotting of blood in your stool or on the toilet paper. If you underwent a bowel prep for your procedure, you may not have a normal bowel movement for a few days.  Please Note:  You might notice some irritation and congestion in your nose or some drainage.  This is from the oxygen used during your procedure.  There is no need for concern and it should clear up in a day or so.  SYMPTOMS TO REPORT IMMEDIATELY:  Following lower endoscopy (colonoscopy or flexible sigmoidoscopy):  Excessive amounts of blood in the stool  Significant tenderness or worsening of abdominal pains  Swelling of the abdomen that is new, acute  Fever of 100F or higher  For urgent or emergent issues, a gastroenterologist can be reached at any hour by calling [phone]. Do not use MyChart messaging for urgent concerns.    DIET:  We do recommend a small meal at first, but then you may proceed to your regular diet.  Drink plenty of fluids but you should avoid alcoholic beverages for 24 hours.  ACTIVITY:  You should plan to take it easy for the  rest of today and you should NOT DRIVE or use heavy machinery until tomorrow (because of the sedation medicines used during the test).    FOLLOW UP: Our staff will call the number listed on your records the next business day following your procedure.  We will call around 7:15- 8:00 am to check on you and address any questions or concerns that you may have regarding the information given to you following your procedure. If we do not reach you, we will leave a message.     If any biopsies were taken you will be contacted by phone or by letter within the next 1-3 weeks.  Please call us  at [phone] if you have not heard about the biopsies in 3 weeks.    SIGNATURES/CONFIDENTIALITY: You and/or your care partner have signed paperwork which will be entered into your electronic medical record.  These signatures attest to the fact that that the information above on your After Visit Summary has been reviewed and is understood.  Full responsibility of the confidentiality of this discharge information lies with you and/or your care-partner.

## 2024-03-20 ENCOUNTER — Telehealth: Payer: Self-pay | Admitting: *Deleted

## 2024-03-20 NOTE — Telephone Encounter (Signed)
" °  Follow up Call-     03/19/2024    8:11 AM  Call back number  Post procedure Call Back phone  # [phone]  Permission to leave phone message Yes     Patient questions:  Do you have a fever, pain , or abdominal swelling? No. Pain Score  0 *  Have you tolerated food without any problems? Yes.    Have you been able to return to your normal activities? Yes.    Do you have any questions about your discharge instructions: Diet   No. Medications  No. Follow up visit  No.  Do you have questions or concerns about your Care? No.  Actions: * If pain score is 4 or above: No action needed, pain <4.   "

## 2024-03-21 ENCOUNTER — Ambulatory Visit: Payer: Self-pay | Admitting: Internal Medicine

## 2024-03-21 LAB — SURGICAL PATHOLOGY

## 2024-03-26 DIAGNOSIS — D126 Benign neoplasm of colon, unspecified: Secondary | ICD-10-CM | POA: Insufficient documentation
